# Patient Record
Sex: Male | Born: 1950 | Race: White | Hispanic: No | Marital: Single | Smoking: Never smoker
Health system: Southern US, Community
[De-identification: ages and names within clinical notes are randomized; demographics above are authoritative.]

## PROBLEM LIST (undated history)

## (undated) DIAGNOSIS — I10 Essential (primary) hypertension: Secondary | ICD-10-CM

## (undated) DIAGNOSIS — J309 Allergic rhinitis, unspecified: Secondary | ICD-10-CM

## (undated) DIAGNOSIS — E785 Hyperlipidemia, unspecified: Secondary | ICD-10-CM

## (undated) DIAGNOSIS — N400 Enlarged prostate without lower urinary tract symptoms: Secondary | ICD-10-CM

## (undated) DIAGNOSIS — K219 Gastro-esophageal reflux disease without esophagitis: Secondary | ICD-10-CM

---

## 2001-11-11 ENCOUNTER — Ambulatory Visit (HOSPITAL_BASED_OUTPATIENT_CLINIC_OR_DEPARTMENT_OTHER): Admission: RE | Admit: 2001-11-11 | Discharge: 2001-11-11 | Payer: Self-pay | Admitting: General Surgery

## 2002-02-20 ENCOUNTER — Emergency Department (HOSPITAL_COMMUNITY): Admission: EM | Admit: 2002-02-20 | Discharge: 2002-02-20 | Payer: Self-pay | Admitting: Emergency Medicine

## 2002-02-20 ENCOUNTER — Encounter: Payer: Self-pay | Admitting: Emergency Medicine

## 2005-08-22 ENCOUNTER — Emergency Department (HOSPITAL_COMMUNITY): Admission: EM | Admit: 2005-08-22 | Discharge: 2005-08-22 | Payer: Self-pay | Admitting: Emergency Medicine

## 2016-11-12 ENCOUNTER — Other Ambulatory Visit: Payer: Self-pay | Admitting: Urology

## 2016-11-12 DIAGNOSIS — R972 Elevated prostate specific antigen [PSA]: Secondary | ICD-10-CM

## 2016-11-29 ENCOUNTER — Ambulatory Visit (HOSPITAL_COMMUNITY)
Admission: RE | Admit: 2016-11-29 | Discharge: 2016-11-29 | Disposition: A | Discharge status: Home / Self Care | Payer: BC Managed Care – PPO | Source: Ambulatory Visit | Attending: Urology | Admitting: Urology

## 2016-11-29 DIAGNOSIS — R972 Elevated prostate specific antigen [PSA]: Secondary | ICD-10-CM

## 2016-11-29 LAB — POCT I-STAT CREATININE: CREATININE: 1 mg/dL (ref 0.61–1.24)

## 2016-11-29 MED ORDER — LIDOCAINE HCL 2 % EX GEL
CUTANEOUS | Status: AC
  Filled 2016-11-29: qty 30

## 2016-11-29 MED ORDER — LIDOCAINE HCL 2 % EX GEL: 1.0000 "application " | Freq: Once | CUTANEOUS | Status: DC

## 2016-11-29 MED ORDER — GADOBENATE DIMEGLUMINE 529 MG/ML IV SOLN
20.0000 mL | Freq: Once | INTRAVENOUS | Status: AC | PRN
  Administered 2016-11-29: 19 mL via INTRAVENOUS

## 2019-08-29 ENCOUNTER — Ambulatory Visit: Payer: BC Managed Care – PPO

## 2019-09-03 ENCOUNTER — Ambulatory Visit: Payer: BC Managed Care – PPO

## 2019-09-05 ENCOUNTER — Ambulatory Visit: Payer: BC Managed Care – PPO

## 2019-11-26 ENCOUNTER — Other Ambulatory Visit: Payer: Self-pay | Admitting: Urology

## 2019-11-26 DIAGNOSIS — R972 Elevated prostate specific antigen [PSA]: Secondary | ICD-10-CM

## 2019-12-18 ENCOUNTER — Other Ambulatory Visit: Payer: BC Managed Care – PPO

## 2021-06-29 DIAGNOSIS — I2510 Atherosclerotic heart disease of native coronary artery without angina pectoris: Secondary | ICD-10-CM

## 2021-06-29 DIAGNOSIS — I700 Atherosclerosis of aorta: Secondary | ICD-10-CM

## 2021-06-29 DIAGNOSIS — I77810 Thoracic aortic ectasia: Secondary | ICD-10-CM | POA: Insufficient documentation

## 2021-06-29 HISTORY — PX: TRANSTHORACIC ECHOCARDIOGRAM: SHX275

## 2021-06-29 HISTORY — DX: Atherosclerotic heart disease of native coronary artery without angina pectoris: I25.10

## 2021-06-29 HISTORY — DX: Atherosclerosis of aorta: I70.0

## 2021-06-30 ENCOUNTER — Encounter (HOSPITAL_BASED_OUTPATIENT_CLINIC_OR_DEPARTMENT_OTHER): Payer: Self-pay | Admitting: *Deleted

## 2021-06-30 ENCOUNTER — Other Ambulatory Visit: Payer: Self-pay

## 2021-06-30 ENCOUNTER — Emergency Department (HOSPITAL_BASED_OUTPATIENT_CLINIC_OR_DEPARTMENT_OTHER): Payer: Medicare PPO

## 2021-06-30 ENCOUNTER — Observation Stay (HOSPITAL_BASED_OUTPATIENT_CLINIC_OR_DEPARTMENT_OTHER)
Admission: EM | Admit: 2021-06-30 | Discharge: 2021-07-02 | Disposition: A | Discharge status: Home / Self Care | Payer: Medicare PPO | Attending: Internal Medicine | Admitting: Internal Medicine

## 2021-06-30 DIAGNOSIS — Z79899 Other long term (current) drug therapy: Secondary | ICD-10-CM | POA: Insufficient documentation

## 2021-06-30 DIAGNOSIS — R0602 Shortness of breath: Secondary | ICD-10-CM | POA: Insufficient documentation

## 2021-06-30 DIAGNOSIS — R002 Palpitations: Secondary | ICD-10-CM | POA: Diagnosis present

## 2021-06-30 DIAGNOSIS — Z20822 Contact with and (suspected) exposure to covid-19: Secondary | ICD-10-CM | POA: Diagnosis not present

## 2021-06-30 DIAGNOSIS — E785 Hyperlipidemia, unspecified: Secondary | ICD-10-CM | POA: Diagnosis present

## 2021-06-30 DIAGNOSIS — I10 Essential (primary) hypertension: Secondary | ICD-10-CM | POA: Diagnosis present

## 2021-06-30 DIAGNOSIS — E876 Hypokalemia: Secondary | ICD-10-CM | POA: Diagnosis not present

## 2021-06-30 DIAGNOSIS — I700 Atherosclerosis of aorta: Secondary | ICD-10-CM | POA: Diagnosis not present

## 2021-06-30 DIAGNOSIS — I4819 Other persistent atrial fibrillation: Secondary | ICD-10-CM

## 2021-06-30 DIAGNOSIS — I4891 Unspecified atrial fibrillation: Secondary | ICD-10-CM | POA: Diagnosis not present

## 2021-06-30 HISTORY — DX: Essential (primary) hypertension: I10

## 2021-06-30 HISTORY — DX: Benign prostatic hyperplasia without lower urinary tract symptoms: N40.0

## 2021-06-30 HISTORY — DX: Hyperlipidemia, unspecified: E78.5

## 2021-06-30 HISTORY — DX: Allergic rhinitis, unspecified: J30.9

## 2021-06-30 HISTORY — DX: Gastro-esophageal reflux disease without esophagitis: K21.9

## 2021-06-30 HISTORY — DX: Other persistent atrial fibrillation: I48.19

## 2021-06-30 LAB — COMPREHENSIVE METABOLIC PANEL
ALT: 14 U/L (ref 0–44)
AST: 17 U/L (ref 15–41)
Albumin: 4.7 g/dL (ref 3.5–5.0)
Alkaline Phosphatase: 55 U/L (ref 38–126)
Anion gap: 12 (ref 5–15)
BUN: 23 mg/dL (ref 8–23)
CO2: 24 mmol/L (ref 22–32)
Calcium: 9.2 mg/dL (ref 8.9–10.3)
Chloride: 101 mmol/L (ref 98–111)
Creatinine, Ser: 1.22 mg/dL (ref 0.61–1.24)
GFR, Estimated: >60 mL/min (ref >60)
Glucose, Bld: 125 mg/dL — ABNORMAL HIGH (ref 70–99)
Potassium: 3.4 mmol/L — ABNORMAL LOW (ref 3.5–5.1)
Sodium: 137 mmol/L (ref 135–145)
Total Bilirubin: 1.3 mg/dL — ABNORMAL HIGH (ref 0.3–1.2)
Total Protein: 7.1 g/dL (ref 6.5–8.1)

## 2021-06-30 LAB — CBC WITH DIFFERENTIAL/PLATELET
Abs Immature Granulocytes: 0.02 10*3/uL (ref 0.00–0.07)
Basophils Absolute: 0 10*3/uL (ref 0.0–0.1)
Basophils Relative: 1 %
Eosinophils Absolute: 0 10*3/uL (ref 0.0–0.5)
Eosinophils Relative: 0 %
HCT: 49.1 % (ref 39.0–52.0)
Hemoglobin: 16.3 g/dL (ref 13.0–17.0)
Immature Granulocytes: 0 %
Lymphocytes Relative: 15 %
Lymphs Abs: 1.1 10*3/uL (ref 0.7–4.0)
MCH: 30.6 pg (ref 26.0–34.0)
MCHC: 33.2 g/dL (ref 30.0–36.0)
MCV: 92.3 fL (ref 80.0–100.0)
Monocytes Absolute: 0.7 10*3/uL (ref 0.1–1.0)
Monocytes Relative: 10 %
Neutro Abs: 5.2 10*3/uL (ref 1.7–7.7)
Neutrophils Relative %: 74 %
Platelets: 202 10*3/uL (ref 150–400)
RBC: 5.32 MIL/uL (ref 4.22–5.81)
RDW: 13.2 % (ref 11.5–15.5)
WBC: 7.1 10*3/uL (ref 4.0–10.5)
nRBC: 0 % (ref 0.0–0.2)

## 2021-06-30 LAB — PROTIME-INR
INR: 1 (ref 0.8–1.2)
Prothrombin Time: 13.2 seconds (ref 11.4–15.2)

## 2021-06-30 LAB — RESP PANEL BY RT-PCR (FLU A&B, COVID) ARPGX2
Influenza A by PCR: NEGATIVE
Influenza B by PCR: NEGATIVE
SARS Coronavirus 2 by RT PCR: NEGATIVE

## 2021-06-30 LAB — D-DIMER, QUANTITATIVE: D-Dimer, Quant: 1.34 ug/mL-FEU — ABNORMAL HIGH (ref 0.00–0.50)

## 2021-06-30 MED ORDER — POTASSIUM CHLORIDE CRYS ER 20 MEQ PO TBCR
40.0000 meq | EXTENDED_RELEASE_TABLET | Freq: Once | ORAL | Status: AC
  Administered 2021-06-30: 40 meq via ORAL
  Filled 2021-06-30: qty 2

## 2021-06-30 MED ORDER — DILTIAZEM HCL-DEXTROSE 125-5 MG/125ML-% IV SOLN (PREMIX)
5.0000 mg/h | INTRAVENOUS | Status: DC
  Administered 2021-06-30: 5 mg/h via INTRAVENOUS
  Administered 2021-07-01: 10 mg/h via INTRAVENOUS
  Filled 2021-06-30 (×2): qty 125

## 2021-06-30 MED ORDER — DILTIAZEM LOAD VIA INFUSION
15.0000 mg | Freq: Once | INTRAVENOUS | Status: AC
  Administered 2021-06-30: 15 mg via INTRAVENOUS
  Filled 2021-06-30: qty 15

## 2021-06-30 MED ORDER — IOHEXOL 350 MG/ML SOLN
100.0000 mL | Freq: Once | INTRAVENOUS | Status: AC | PRN
  Administered 2021-06-30: 100 mL via INTRAVENOUS

## 2021-06-30 MED ORDER — SODIUM CHLORIDE 0.9% FLUSH
3.0000 mL | Freq: Once | INTRAVENOUS | Status: AC
  Administered 2021-06-30: 3 mL via INTRAVENOUS
  Filled 2021-06-30: qty 3

## 2021-06-30 MED ORDER — HEPARIN (PORCINE) 25000 UT/250ML-% IV SOLN
1450.0000 [IU]/h | INTRAVENOUS | Status: DC
  Administered 2021-06-30: 1300 [IU]/h via INTRAVENOUS
  Administered 2021-07-01: 1450 [IU]/h via INTRAVENOUS
  Filled 2021-06-30 (×2): qty 250

## 2021-06-30 MED ORDER — HEPARIN BOLUS VIA INFUSION
4000.0000 [IU] | Freq: Once | INTRAVENOUS | Status: AC
  Administered 2021-06-30: 4000 [IU] via INTRAVENOUS

## 2021-06-30 NOTE — ED Triage Notes (Signed)
C/o palpitations x 1 week , sent here PMD for new onset A fib

## 2021-06-30 NOTE — ED Notes (Signed)
Attempted to call report, RN unavailable, will call back. 

## 2021-06-30 NOTE — ED Provider Notes (Signed)
[facility]ER[facility]Y DEPARTMENT Provider Note   CSN: [id number] Arrival date & time: 06/30/21  1648     History Chief Complaint  Patient presents with   Palpitations    Albert [name] is a 70 y.o. male.  HPI      One [place] the beach Noted some lightheadedness with position changes Fluttering, "trembling" feeling in chest, palpitations Started monitoring BP with BP cuff , sometimes ok, sometiems low, had been going to [facility] to exercise, would sometimes check BP, was seeing values like 80/56 not sure if correct, sometimes home monitor would say irregular heart beat No pain Having fluttering, feel it more around chest Called PCP, did ECG, said it was atrial fibrillation Went upstairs to get labwork No chest pain, but maybe a little winded No leg swelling or pain 3hr 65min carride, no other recent surgeries, immobilization, long trips No nausea, vomiting, diarrnea No cough, fever, recent illness, black/bloody stools, numbness/weakness    Past Medical History:  Diagnosis Date   Allergic rhinitis    BPH (benign prostatic hyperplasia)    GERD (gastroesophageal reflux disease)    Hyperlipidemia    Hypertension     Patient Active Problem List   Diagnosis Date Noted   Palpitations 07/01/2021   Hypokalemia 07/01/2021   Hypertension    Hyperlipidemia    Atrial fibrillation with RVR ([place]) 06/30/2021    History reviewed. No pertinent surgical history.     History reviewed. No pertinent family history.  Social History   Tobacco Use   Smoking status: Never   Smokeless tobacco: Never  Substance Use Topics   Alcohol use: Not Currently   Drug use: Not Currently    Home Medications Prior to Admission medications   Not on File    Allergies    Patient has no known allergies.  Review of Systems   Review of Systems  Constitutional:  Negative for fever.  HENT:  Negative for sore throat.   Eyes:  Negative for visual disturbance.   Respiratory:  Positive for shortness of breath. Negative for cough.   Cardiovascular:  Positive for palpitations. Negative for chest pain and leg swelling.  Gastrointestinal:  Negative for abdominal pain, nausea and vomiting.  Genitourinary:  Negative for difficulty urinating.  Musculoskeletal:  Negative for back pain and neck stiffness.  Skin:  Negative for rash.  Neurological:  Positive for light-headedness. Negative for syncope and headaches.   Physical Exam Updated Vital Signs BP 139/81 (BP Location: Left Arm)   Pulse 93   Temp 97.8 F (36.6 C) (Oral)   Resp 18   Ht 6' (1.829 m)   Wt 86.8 kg   SpO2 96%   BMI 25.94 kg/m   Physical Exam Vitals and nursing note reviewed.  Constitutional:      General: He is not in acute distress.    Appearance: He is well-developed. He is not diaphoretic.  HENT:     Head: Normocephalic and atraumatic.  Eyes:     Conjunctiva/sclera: Conjunctivae normal.  Cardiovascular:     Rate and Rhythm: Tachycardia present. Rhythm irregular.     Heart sounds: Normal heart sounds. No murmur heard.   No friction rub. No gallop.  Pulmonary:     Effort: Pulmonary effort is normal. No respiratory distress.     Breath sounds: Normal breath sounds. No wheezing or rales.  Abdominal:     General: There is no distension.     Palpations: Abdomen is soft.     Tenderness:  There is no abdominal tenderness. There is no guarding.  Musculoskeletal:     Cervical back: Normal range of motion.  Skin:    General: Skin is warm and dry.  Neurological:     Mental Status: He is alert and oriented to person, place, and time.    ED Results / Procedures / Treatments   Labs (all labs ordered are listed, but only abnormal results are displayed) Labs Reviewed  COMPREHENSIVE METABOLIC PANEL - Abnormal; Notable for the following components:      Result Value   Potassium 3.4 (*)    Glucose, Bld 125 (*)    Total Bilirubin 1.3 (*)    All other components within normal  limits  D-DIMER, QUANTITATIVE - Abnormal; Notable for the following components:   D-Dimer, Quant 1.34 (*)    All other components within normal limits  RESP PANEL BY RT-PCR (FLU A&B, COVID) [id number]  PROTIME-INR  CBC WITH DIFFERENTIAL/PLATELET  TSH  T4, FREE  HEPARIN LEVEL (UNFRACTIONATED)  CBC  HIV ANTIBODY (ROUTINE TESTING W REFLEX)  MAGNESIUM  COMPREHENSIVE METABOLIC PANEL  URINALYSIS, COMPLETE (UACMP) WITH MICROSCOPIC  RAPID URINE DRUG SCREEN, HOSP PERFORMED  BRAIN NATRIURETIC PEPTIDE    EKG EKG Interpretation  Date/Time:  Friday June 30 2021 17:00:19 EST Ventricular Rate:  146 PR Interval:    QRS Duration: 85 QT Interval:  317 QTC Calculation: 494 R Axis:   -10 Text Interpretation: Atrial fibrillation Paired ventricular premature complexes Minimal ST elevation, inferior leads Borderline prolonged QT interval No previous ECGs available Confirmed by Alvira Monday ([id number]) on 06/30/2021 5:02:26 PM  Radiology DG Chest 2 View  Result Date: 06/30/2021 CLINICAL DATA:  One week of palpitations new onset AFib EXAM: CHEST - 2 VIEW COMPARISON:  July 01, 2015 FINDINGS: Normal size heart. Tortuous thoracic aorta. Chronic bronchitic lung changes. No pleural effusion or pneumothorax. Thoracic spondylosis. IMPRESSION: No active cardiopulmonary disease. Electronically Signed   By: Maudry Mayhew M.D.   On: 06/30/2021 17:51   CT Angio Chest PE W and/or Wo Contrast  Result Date: 06/30/2021 CLINICAL DATA:  Elevated D-dimer level, cardiac palpitations, new onset atrial fibrillation. EXAM: CT ANGIOGRAPHY CHEST WITH CONTRAST TECHNIQUE: Multidetector CT imaging of the chest was performed using the standard protocol during bolus administration of intravenous contrast. Multiplanar CT image reconstructions and MIPs were obtained to evaluate the vascular anatomy. CONTRAST:  OMNIPAQUE IOHEXOL 350 MG/ML SOLN COMPARISON:  Overlapping portions CT abdomen 10/22/2018 FINDINGS: Cardiovascular: No  filling defect is identified in the pulmonary arterial tree to suggest pulmonary embolus. Aortic arch and left anterior descending coronary artery atherosclerotic calcifications. Upper normal heart size. Mediastinum/Nodes: Small type 1 hiatal hernia. No pathologic adenopathy in the chest. Lungs/Pleura: Unremarkable Upper Abdomen: Multiple gallstones in the gallbladder measuring up to about 1.2 cm in diameter. Exophytic right kidney upper pole fluid density lesion favoring cyst although only partially included on today's exam. Mild bilateral perirenal stranding appears roughly symmetric. Musculoskeletal: Thoracic spondylosis. Review of the MIP images confirms the above findings. IMPRESSION: 1. No filling defect is identified in the pulmonary arterial tree to suggest pulmonary embolus. 2. Other imaging findings of potential clinical significance: Aortic Atherosclerosis (ICD10-I70.0). Coronary atherosclerosis. Small type 1 hiatal hernia. Cholelithiasis. Thoracic spondylosis. Electronically Signed   By: Gaylyn Rong M.D.   On: 06/30/2021 20:32    Procedures .Critical Care Performed by: Alvira Monday, MD Authorized by: Alvira Monday, MD   Critical care provider statement:    Critical care time (minutes):  30   Critical care  was time spent personally by me on the following activities:  Development of treatment plan with patient or surrogate, discussions with consultants, evaluation of patient's response to treatment, examination of patient, ordering and review of laboratory studies, ordering and review of radiographic studies, ordering and performing treatments and interventions, pulse oximetry, re-evaluation of patient's condition and review of old charts   Medications Ordered in ED Medications  diltiazem (CARDIZEM) 1 mg/mL load via infusion 15 mg (15 mg Intravenous Bolus from Bag 06/30/21 1746)    And  diltiazem (CARDIZEM) 125 mg in dextrose 5% 125 mL (1 mg/mL) infusion (10 mg/hr Intravenous  Rate/Dose Change 06/30/21 1848)  heparin ADULT infusion 100 units/mL (25000 units/276mL) (1,300 Units/hr Intravenous New Bag/Given 06/30/21 2034)  acetaminophen (TYLENOL) tablet 650 mg (has no administration in time range)    Or  acetaminophen (TYLENOL) suppository 650 mg (has no administration in time range)  sodium chloride flush (NS) 0.9 % injection 3 mL (3 mLs Intravenous Given 06/30/21 1750)  potassium chloride SA (KLOR-CON M) CR tablet 40 mEq (40 mEq Oral Given 06/30/21 1816)  heparin bolus via infusion 4,000 Units (4,000 Units Intravenous Bolus from Bag 06/30/21 2034)  iohexol (OMNIPAQUE) 350 MG/ML injection 100 mL (100 mLs Intravenous Contrast Given 06/30/21 2021)    ED Course  I have reviewed the triage vital signs and the nursing notes.  Pertinent labs & imaging results that were available during my care of the patient were reviewed by me and considered in my medical decision making (see chart for details).    MDM Rules/Calculators/A&P                           70yo male with history of hypertension, hyperlipidemia presents with concern for palpitations, new onset atrial fibrillation with rapid rate.   Chest x-ray shows no evidence of pulmonary edema, pneumonia or other acute abnormalities.  CBC shows a normal hemoglobin and white blood cell count.  CMP shows very mild hypokalemia, no other significant electrolyte abnormalities.  D-dimer positive.  Initial heart rate up to the 160s with atrial fibrillation and RVR.  He is started on diltiazem bolus and drip.  PE study negative for acute PE.   CHADSVASC 2. Discussed risks and benefits of anticoagulation.  Diltiazem gtt to 10, HR improved to 100s. Admitted for further care.  Final Clinical Impression(s) / ED Diagnoses Final diagnoses:  Atrial fibrillation with RVR [facility])    Rx / DC Orders ED Discharge Orders     None        Alvira Monday, MD 07/01/21 [phone]

## 2021-06-30 NOTE — Progress Notes (Signed)
ANTICOAGULATION CONSULT NOTE - Initial Consult  Pharmacy Consult for Heparin Indication: atrial fibrillation  No Known Allergies  Patient Measurements: Height: 6' (182.9 cm) Weight: 88.9 kg (196 lb) IBW/kg (Calculated) : 77.6 Heparin Dosing Weight: 88.9 kg  Vital Signs: Temp: 98.3 F (36.8 C) (12/02 1701) Temp Source: Oral (12/02 1701) BP: 122/80 (12/02 1845) Pulse Rate: 98 (12/02 1845)  Labs: Recent Labs    06/30/21 1710  HGB 16.3  HCT 49.1  PLT 202  LABPROT 13.2  INR 1.0  CREATININE 1.22    Estimated Creatinine Clearance: 63.6 mL/min (by C-G formula based on SCr of 1.22 mg/dL).   Medical History: Past Medical History:  Diagnosis Date   Hyperlipidemia    Hypertension     Medications:  (Not in a hospital admission)  Scheduled:  Infusions:   diltiazem (CARDIZEM) infusion 10 mg/hr (06/30/21 1848)   PRN:   Assessment: 70 yom presenting with palpitations x 1 week. Heparin per pharmacy consult placed for atrial fibrillation.  Patient is on not on anticoagulation prior to arrival.  Hgb 16.3; plt 202  Goal of Therapy:  Heparin level 0.3-0.7 units/ml Monitor platelets by anticoagulation protocol: Yes   Plan:  Give 4000 units bolus x 1 Start heparin infusion at 1300 units/hr Check anti-Xa level in 6-8 hours and daily while on heparin Continue to monitor H&H and platelets  Delmar Landau, PharmD, BCPS 06/30/2021 7:39 PM ED Clinical Pharmacist -  [phone]

## 2021-07-01 ENCOUNTER — Encounter (HOSPITAL_COMMUNITY): Payer: Self-pay | Admitting: Family Medicine

## 2021-07-01 ENCOUNTER — Inpatient Hospital Stay (HOSPITAL_BASED_OUTPATIENT_CLINIC_OR_DEPARTMENT_OTHER): Payer: Medicare PPO

## 2021-07-01 DIAGNOSIS — I4891 Unspecified atrial fibrillation: Secondary | ICD-10-CM

## 2021-07-01 DIAGNOSIS — R739 Hyperglycemia, unspecified: Secondary | ICD-10-CM

## 2021-07-01 DIAGNOSIS — E876 Hypokalemia: Secondary | ICD-10-CM | POA: Diagnosis not present

## 2021-07-01 DIAGNOSIS — I10 Essential (primary) hypertension: Secondary | ICD-10-CM | POA: Diagnosis not present

## 2021-07-01 DIAGNOSIS — R002 Palpitations: Secondary | ICD-10-CM | POA: Diagnosis present

## 2021-07-01 DIAGNOSIS — I700 Atherosclerosis of aorta: Secondary | ICD-10-CM | POA: Diagnosis not present

## 2021-07-01 DIAGNOSIS — E785 Hyperlipidemia, unspecified: Secondary | ICD-10-CM | POA: Diagnosis present

## 2021-07-01 DIAGNOSIS — Z20822 Contact with and (suspected) exposure to covid-19: Secondary | ICD-10-CM | POA: Diagnosis not present

## 2021-07-01 LAB — BRAIN NATRIURETIC PEPTIDE: B Natriuretic Peptide: 189.7 pg/mL — ABNORMAL HIGH (ref 0.0–100.0)

## 2021-07-01 LAB — ECHOCARDIOGRAM COMPLETE
AR max vel: 3.42 cm2
AV Peak grad: 8.8 mmHg
Ao pk vel: 1.48 m/s
Area-P 1/2: 4.65 cm2
Calc EF: 61.1 %
Height: 72 in
P 1/2 time: 417 msec
S' Lateral: 3 cm
Single Plane A2C EF: 55.8 %
Single Plane A4C EF: 64.8 %
Weight: 3058.22 oz

## 2021-07-01 LAB — CBC
HCT: 45.7 % (ref 39.0–52.0)
Hemoglobin: 15.1 g/dL (ref 13.0–17.0)
MCH: 30.5 pg (ref 26.0–34.0)
MCHC: 33 g/dL (ref 30.0–36.0)
MCV: 92.3 fL (ref 80.0–100.0)
Platelets: 213 10*3/uL (ref 150–400)
RBC: 4.95 MIL/uL (ref 4.22–5.81)
RDW: 13.2 % (ref 11.5–15.5)
WBC: 6.4 10*3/uL (ref 4.0–10.5)
nRBC: 0 % (ref 0.0–0.2)

## 2021-07-01 LAB — URINALYSIS, COMPLETE (UACMP) WITH MICROSCOPIC
Bacteria, UA: NONE SEEN
Bilirubin Urine: NEGATIVE
Glucose, UA: NEGATIVE mg/dL
Hgb urine dipstick: NEGATIVE
Ketones, ur: NEGATIVE mg/dL
Leukocytes,Ua: NEGATIVE
Nitrite: NEGATIVE
Protein, ur: NEGATIVE mg/dL
Specific Gravity, Urine: 1.043 — ABNORMAL HIGH (ref 1.005–1.030)
pH: 5 (ref 5.0–8.0)

## 2021-07-01 LAB — COMPREHENSIVE METABOLIC PANEL
ALT: 12 U/L (ref 0–44)
AST: 13 U/L — ABNORMAL LOW (ref 15–41)
Albumin: 3.7 g/dL (ref 3.5–5.0)
Alkaline Phosphatase: 46 U/L (ref 38–126)
Anion gap: 7 (ref 5–15)
BUN: 12 mg/dL (ref 8–23)
CO2: 25 mmol/L (ref 22–32)
Calcium: 8.9 mg/dL (ref 8.9–10.3)
Chloride: 107 mmol/L (ref 98–111)
Creatinine, Ser: 1.11 mg/dL (ref 0.61–1.24)
GFR, Estimated: >60 mL/min (ref >60)
Glucose, Bld: 111 mg/dL — ABNORMAL HIGH (ref 70–99)
Potassium: 4 mmol/L (ref 3.5–5.1)
Sodium: 139 mmol/L (ref 135–145)
Total Bilirubin: 1.1 mg/dL (ref 0.3–1.2)
Total Protein: 5.9 g/dL — ABNORMAL LOW (ref 6.5–8.1)

## 2021-07-01 LAB — RAPID URINE DRUG SCREEN, HOSP PERFORMED
Amphetamines: NOT DETECTED
Barbiturates: NOT DETECTED
Benzodiazepines: NOT DETECTED
Cocaine: NOT DETECTED
Opiates: NOT DETECTED
Tetrahydrocannabinol: NOT DETECTED

## 2021-07-01 LAB — HIV ANTIBODY (ROUTINE TESTING W REFLEX): HIV Screen 4th Generation wRfx: NONREACTIVE

## 2021-07-01 LAB — HEPARIN LEVEL (UNFRACTIONATED): Heparin Unfractionated: 0.2 IU/mL — ABNORMAL LOW (ref 0.30–0.70)

## 2021-07-01 LAB — MAGNESIUM: Magnesium: 2.1 mg/dL (ref 1.7–2.4)

## 2021-07-01 LAB — TSH: TSH: 2.025 u[IU]/mL (ref 0.350–4.500)

## 2021-07-01 LAB — T4, FREE: Free T4: 1.11 ng/dL (ref 0.61–1.12)

## 2021-07-01 MED ORDER — ACETAMINOPHEN 325 MG PO TABS: 650.0000 mg | ORAL_TABLET | Freq: Four times a day (QID) | ORAL | Status: DC | PRN

## 2021-07-01 MED ORDER — APIXABAN 5 MG PO TABS
5.0000 mg | ORAL_TABLET | Freq: Two times a day (BID) | ORAL | Status: DC
  Administered 2021-07-01 – 2021-07-02 (×3): 5 mg via ORAL
  Filled 2021-07-01 (×3): qty 1

## 2021-07-01 MED ORDER — DILTIAZEM HCL 60 MG PO TABS: 30.0000 mg | ORAL_TABLET | Freq: Three times a day (TID) | ORAL | Status: DC | PRN

## 2021-07-01 MED ORDER — DILTIAZEM HCL ER COATED BEADS 240 MG PO CP24
240.0000 mg | ORAL_CAPSULE | Freq: Every day | ORAL | Status: DC
  Administered 2021-07-01 – 2021-07-02 (×2): 240 mg via ORAL
  Filled 2021-07-01 (×2): qty 1

## 2021-07-01 MED ORDER — ROSUVASTATIN CALCIUM 5 MG PO TABS
10.0000 mg | ORAL_TABLET | Freq: Every day | ORAL | Status: DC
  Administered 2021-07-01 – 2021-07-02 (×2): 10 mg via ORAL
  Filled 2021-07-01 (×2): qty 2

## 2021-07-01 MED ORDER — ACETAMINOPHEN 650 MG RE SUPP: 650.0000 mg | Freq: Four times a day (QID) | RECTAL | Status: DC | PRN

## 2021-07-01 NOTE — H&P (Signed)
History and Physical    PLEASE NOTE THAT DRAGON DICTATION SOFTWARE WAS USED IN THE CONSTRUCTION OF THIS NOTE.   Albert [name] [id number] DOB: [birth year] DOA: 06/30/2021  PCP: Jonathon Bellows, PA-C  Patient coming from: home   I have personally briefly reviewed patient's old medical records in [place]  Chief Complaint: Palpitations  HPI: Albert [name] is a 70 y.o. male with medical history significant for hypertension, hyperlipidemia, who is admitted to [facility] on 06/30/2021 by way of transfer from [place] ED with new diagnosis of atrial fibrillation with RVR after presenting from  to [facility] from PCP's office for further evaluation of afib rvr.   The patient reports that he has been experiencing new palpitations over the course of the last week.  Denies any associated chest pain, diaphoresis, dizziness, presyncope, or syncope over that timeframe.  Denies any shortness of breath at rest, but has noted some increased shortness of breath with ambulating upstairs over the last week.  Not associate with any orthopnea, PND, or new onset peripheral edema.  Denies any recent subjective fever, chills, rigors, or generalized myalgias.  No nausea, vomiting, diarrhea, abdominal pain.  Denies any associated cough, hemoptysis, calf tenderness, or new lower extremity erythema.   Denies any personal or family history of acute DVT/PE.  He reports that he recently traveled via car ride to the beach, noting continuous travel time of approximately 3 hours in the setting.  Otherwise, no recent traveling, denies any recent trauma, surgical procedures.  No history of underlying malignancy.  Not on any blood thinners as an outpatient, including no aspirin.  Denies any regular or recent alcohol consumption and no use of recreational drugs.  No known chronic underlying pulmonary pathology, and reports that he is a lifelong non-smoker.  Denies any known history of prior  atrial fibrillation.  Over the last several days, when working out at [employer], the cardiac monitor has been reading "irregular" which represents a change for him.  He subsequently presented to his PCPs office earlier today, at which time EKG demonstrated atrial fibrillation with RVR, prompting patient to be instructed to present to local emergency department for further evaluation and management of atrial fibrillation with RVR.  Subsequently, he presented to [place] ED.      [facility] ED Course:  Vital signs in the ED were notable for the following: Afebrile; initial heart rates reportedly in the 160s, subsequently improving into the 90s to low 100s following initiation of diltiazem drip; blood pressure 108/67 -141/91; respiratory rate 16-23, oxygen saturation 97 to 100% on room air.  Labs were notable for the following: CMP notable for the following: Sodium 137, potassium 3.2, creatinine 1.22, and liver enzymes found to be within normal limits.  TSH 2.025.  CBC notable for white cell count 7100, hemoglobin 16.3.  D-dimer 1.34.  INR 1.0.  COVID-19/influenza PCR were checked in the ED this evening, and found to be negative.  Imaging and additional notable ED work-up: Initial EKG at [facility] demonstrated atrial fibrillation with RVR and single PVC associated with ventricular rate of 146, nonspecific T wave inversion in aVL, without prior EKG available for point comparison, no evidence of ST changes, including no evidence of ST elevation.  Chest x-ray showed no evidence of acute cardiopulmonary process, including no evidence of infiltrate, edema, effusion, or pneumothorax.  In the setting of the above symptoms and an elevated D-dimer, CTA chest was pursued  and showed no evidence of acute pulmonary embolism, and no evidence of acute pulmonary pathology, including no infiltrate, edema, effusion, or pneumothorax.  While in the ED, the following were administered:  Diltiazem 15 mg IV bolus followed by initiation of diltiazem drip.  Heparin bolus followed by initiation of heparin drip.  Potassium chloride 40 mEq p.o. x1.  Subsequently the patient was transferred for admission to [facility] for further evaluation management of new diagnosis of atrial fibrillation with RVR.     Review of Systems: As per HPI otherwise 10 point review of systems negative.   Past Medical History:  Diagnosis Date   Hyperlipidemia    Hypertension     History reviewed. No pertinent surgical history.  Social History:  reports that he has never smoked. He has never used smokeless tobacco. He reports that he does not currently use alcohol. He reports that he does not currently use drugs.   No Known Allergies  History reviewed. No pertinent family history.   Outpatient medications include the following: HCTZ, lisinopril, rosuvastatin 10 mg p.o. daily.   Objective    Physical Exam: Vitals:   06/30/21 2330 06/30/21 2345 06/30/21 2359 07/01/21 0051  BP: 108/67 (!) 112/52  139/81  Pulse: 91 66  93  Resp: 20 19  18   Temp:   98.4 F (36.9 C) 97.8 F (36.6 C)  TempSrc:   Oral Oral  SpO2: 98% 97%  96%  Weight:    86.8 kg  Height:    6' (1.829 m)    General: appears to be stated age; alert, oriented Skin: warm, dry, no rash Head:  AT/[place] Mouth:  Oral mucosa membranes appear moist, normal dentition Neck: supple; trachea midline Heart:  RRR; did not appreciate any M/R/G Lungs: CTAB, did not appreciate any wheezes, rales, or rhonchi Abdomen: + BS; soft, ND, NT Vascular: 2+ pedal pulses b/l; 2+ radial pulses b/l Extremities: no peripheral edema, no muscle wasting Neuro: strength and sensation intact in upper and lower extremities b/l     Labs on Admission: I have personally reviewed following labs and imaging studies  CBC: Recent Labs  Lab 06/30/21 1710  WBC 7.1  NEUTROABS 5.2  HGB 16.3  HCT 49.1  MCV 92.3  PLT 123XX123   Basic Metabolic Panel: Recent  Labs  Lab 06/30/21 1710  NA 137  K 3.4*  CL 101  CO2 24  GLUCOSE 125*  BUN 23  CREATININE 1.22  CALCIUM 9.2   GFR: Estimated Creatinine Clearance: 63.6 mL/min (by C-G formula based on SCr of 1.22 mg/dL). Liver Function Tests: Recent Labs  Lab 06/30/21 1710  AST 17  ALT 14  ALKPHOS 55  BILITOT 1.3*  PROT 7.1  ALBUMIN 4.7   No results for input(s): LIPASE, AMYLASE in the last 168 hours. No results for input(s): AMMONIA in the last 168 hours. Coagulation Profile: Recent Labs  Lab 06/30/21 1710  INR 1.0   Cardiac Enzymes: No results for input(s): CKTOTAL, CKMB, CKMBINDEX, TROPONINI in the last 168 hours. BNP (last 3 results) No results for input(s): PROBNP in the last 8760 hours. HbA1C: No results for input(s): HGBA1C in the last 72 hours. CBG: No results for input(s): GLUCAP in the last 168 hours. Lipid Profile: No results for input(s): CHOL, HDL, LDLCALC, TRIG, CHOLHDL, LDLDIRECT in the last 72 hours. Thyroid Function Tests: Recent Labs    06/30/21 1710  TSH 2.025  FREET4 1.11   Anemia Panel: No results for input(s): VITAMINB12, FOLATE, FERRITIN, TIBC, IRON, RETICCTPCT  in the last 72 hours. Urine analysis: No results found for: COLORURINE, APPEARANCEUR, [place], [place], [place], [place], [place], [place], [place], [place], NITRITE, LEUKOCYTESUR  Radiological Exams on Admission: DG Chest 2 View  Result Date: 06/30/2021 CLINICAL DATA:  One week of palpitations new onset AFib EXAM: CHEST - 2 VIEW COMPARISON:  July 01, 2015 FINDINGS: Normal size heart. Tortuous thoracic aorta. Chronic bronchitic lung changes. No pleural effusion or pneumothorax. Thoracic spondylosis. IMPRESSION: No active cardiopulmonary disease. Electronically Signed   By: Dahlia Bailiff M.D.   On: 06/30/2021 17:51   CT Angio Chest PE W and/or Wo Contrast  Result Date: 06/30/2021 CLINICAL DATA:  Elevated D-dimer level, cardiac palpitations, new onset atrial fibrillation. EXAM:  CT ANGIOGRAPHY CHEST WITH CONTRAST TECHNIQUE: Multidetector CT imaging of the chest was performed using the standard protocol during bolus administration of intravenous contrast. Multiplanar CT image reconstructions and MIPs were obtained to evaluate the vascular anatomy. CONTRAST:  119mL OMNIPAQUE IOHEXOL 350 MG/ML SOLN COMPARISON:  Overlapping portions CT abdomen 10/22/2018 FINDINGS: Cardiovascular: No filling defect is identified in the pulmonary arterial tree to suggest pulmonary embolus. Aortic arch and left anterior descending coronary artery atherosclerotic calcifications. Upper normal heart size. Mediastinum/Nodes: Small type 1 hiatal hernia. No pathologic adenopathy in the chest. Lungs/Pleura: Unremarkable Upper Abdomen: Multiple gallstones in the gallbladder measuring up to about 1.2 cm in diameter. Exophytic right kidney upper pole fluid density lesion favoring cyst although only partially included on today's exam. Mild bilateral perirenal stranding appears roughly symmetric. Musculoskeletal: Thoracic spondylosis. Review of the MIP images confirms the above findings. IMPRESSION: 1. No filling defect is identified in the pulmonary arterial tree to suggest pulmonary embolus. 2. Other imaging findings of potential clinical significance: Aortic Atherosclerosis (ICD10-I70.0). Coronary atherosclerosis. Small type 1 hiatal hernia. Cholelithiasis. Thoracic spondylosis. Electronically Signed   By: Van Clines M.D.   On: 06/30/2021 20:32     EKG: Independently reviewed, with result as described above.    Assessment/Plan   Principal Problem:   Atrial fibrillation with RVR (HCC) Active Problems:   Palpitations   Hypokalemia   Hypertension   Hyperlipidemia    #) Atrial fibrillation with RVR: In the setting of no known history of atrial fibrillation, was found to be in atrial fibrillation with RVR initial heart rates in the 160s when presenting to PCPs office for further evaluation of 1 week of  palpitations.  Not associate with any chest pain.  Rate control improving on diltiazem drip, with blood pressure tolerating both the RVR as well as interval initiation of diltiazem drip, without any evidence of hypotension.  EKG confirmed atrial fibrillation with RVR, and showed nonspecific T wave inversion in aVL, but otherwise no evidence of acute ischemic changes, including no evidence of ST elevation.  No prior echocardiogram per chart review.  In the setting of a CHA2DS2-VASc score of 3, there is an indication for the patient to be on chronic anticoagulation for thromboembolic prophylaxis.  Consequently, started on heparin drip at [facility] ED, with plan to convert to Eliquis.  Not currently on any oral AV nodal blocking agents at home.  Will warrant initiation of p.o. AV nodal blocking agent prior to discontinuation of diltiazem drip to prevent rebound tachycardia.  No clinical or radiographic evidence to suggest acutely decompensated heart failure.  No evidence of underlying infectious stimuli, including chest x-ray without acute process, while COVID-19/influenza PCR found to be negative. Will also eval UA.  TSH within normal limits.   Plan: Monitor strict I's and  O's and daily weights. Monitor on telemetry. Check serum magnesium level with prn supplementation to maintain levels of greater than or equal to 2.0.  Further evaluation management of presenting hypokalemia, as further detailed below, with repeat BMP in the AM.  Repeat CBC in the AM.  Diltiazem drip, with plan to initiate oral AV nodal blocking agent prior to discontinuation of this drip.  Heparin drip, with plan to convert to Eliquis.  Check urinalysis/urinary drug screen.  Echocardiogram ordered for the morning.  Add on BNP.  Repeat EKG to document, further evaluate interval improvement in rate control.      #) Hypokalemia: Presenting serum potassium mildly low at 3.4.  Status post potassium chloride 40 mill equivalents p.o.  x1 dose.  Plan: Repeat BMP in the morning.  Add on serum magnesium level.  Monitor on telemetry.       #) Essential Hypertension: documented history of such, with outpatient antihypertensive regimen including lisinopril and HCTZ.  Systolic blood pressures pressure in the ED today but in the low 100s to 140s, without any associated hypotension in spite of atrial fibrillation with RVR and interval initiation of diltiazem drip.   Plan: Close monitoring of subsequent blood pressure via routine vital signs. Will plan to resume outpatient lisinopril if no evidence of interval hypotension.  Holding home HCTZ for now.       #) Hyperlipidemia: documented h/o such. On rosuvastatin as outpatient.   Plan: continue home statin.      DVT prophylaxis: SCD's, hep drip   Code Status: Full code Family Communication: none Disposition Plan: Per Rounding Team Consults called: none;  Admission status: inpt, pcu  Warrants inpatient status on basis of need for further evaluation and management of new diagnosis of atrial fibrillation with RVR, including administration of IV AV nodal blocking agents with close monitoring of ensuing rate control as well as further evaluation of underlying contributing factors, including pursuit of echocardiogram along with close monitoring of rate rhythm on telemetry.   PLEASE NOTE THAT DRAGON DICTATION SOFTWARE WAS USED IN THE CONSTRUCTION OF THIS NOTE.   Chaney Born Aamirah Salmi DO [facility]  From 7PM - 7AM   07/01/2021, 1:36 AM

## 2021-07-01 NOTE — Progress Notes (Signed)
Care started prior to midnight in the emergency room and patient was admitted early this morning after midnight by Dr. Newton Pigg and I am in current agreement with his assessment and plan.  Additional changes to the plan of care have been made accordingly.  The patient is a 70 year old with a past medical history significant for but not limited to hypertension, hyperlipidemia as well as other comorbidities who was admitted to [facility] on 06/30/2021 via med [facility] transfer for atrial fibrillation with RVR.  Patient reported experiencing new palpitations last week or so and denies chest pain, diaphoresis, dizziness or presyncope or syncope.  States that he did feel lightheaded when he would bend over and just attributed to being dehydrated.  He had no new onset peripheral edema and denies any history of DVT or PE.  He recently traveled to the beach and is fairly active usually and over last several days when he is working out at the [facility] the cardiac monitor read "irregular rhythm" which is a change for him.  He presented to his PCP office yesterday at which time it demonstrated A. fib with RVR and so he was sent to [facility].  In the ED he was noted to be A. fib with RVR with a ventricular rate of 146 and nonspecific T wave inversion in aVL.  Chest x-ray was done and showed no acute cardiopulmonary process and there is no evidence of infiltrate, edema or pneumothorax.  D-dimer was elevated slightly so CT of the chest was done and showed no acute PE and there is no evidence of acute pulmonary pathology or infiltrate, edema effusion or pneumothorax.  The ED he was initiated on diltiazem and heparin drip and transferred to [facility] for new onset A. fib with RVR. Currently he is being admitted and treated for the following but not limited too:  A Fib With RVR -Presented with A. fib with RVR in the 160s and has had been having palpitations for the last week or so -No chest pain and was  initiated on diltiazem drip with improvement in his rate -EKG confirmed A. fib with RVR and showed nonspecific T wave inversion in aVL -CHA2DS2-VASc score of 3 and he was initiated on anticoagulation with a heparin drip and will be changed to p.o. apixaban -Check TSH and echocardiogram -Patient remains on a diltiazem drip and is being transitioned from adult drip to diltiazem to 40 mg CD daily as well as being initiated on diltiazem 30 mg p.o. 3 times daily as needed for breakthrough -Cardiology was consulted for further evaluation recommendations and cardiology is transition the patient to p.o. Cardizem -BNP was mildly elevated 189.7 but appears euvolemic  Hypokalemia -Mild at 3.4 and will replete -Repeat potassium this morning was 4.0 -Check magnesium level in the a.m. -Continue to monitor CMP  Essential hypertension -He takes lisinopril hydrochlorothiazide in outpatient setting -Currently initiated on diltiazem drip -His HCTZ has been held currently in his lisinopril has been resumed -Cardiology is following and recommending change to p.o.  Hyperlipidemia -Continue statin  Hyperglycemia -Check hemoglobin A1c in a.m. -Continue to monitor blood sugars per protocol and if necessary place on sensitive NovoLog/scale insulin before meals and at bedtime  We will continue to monitor the patient's clinical response to intervention and repeat blood work in the a.m. and follow-up on specialist recommendations

## 2021-07-01 NOTE — Progress Notes (Signed)
Pt transitioned from Diltiazem IV to PO and from Heparin IV to Eliquis PO. Education including but not limited to indication, monitoring, risks and adverse effects provided to patient and spouse as well as when to call a provider or check into an emergency room. All questions and concerns addressed. Pt & spouse verbalized understanding. Currently running atrial fibrillation in the 80s. Call bell placed within reach. Will continue to monitor and maintain safety.

## 2021-07-01 NOTE — Progress Notes (Addendum)
ANTICOAGULATION CONSULT NOTE - Initial Consult  Pharmacy Consult for Heparin>apixaban Indication: atrial fibrillation  No Known Allergies  Patient Measurements: Height: 6' (182.9 cm) Weight: 86.7 kg (191 lb 2.2 oz) IBW/kg (Calculated) : 77.6 Heparin Dosing Weight: 88.9 kg  Vital Signs: Temp: 97.8 F (36.6 C) (12/03 0543) Temp Source: Oral (12/03 0543) BP: 96/79 (12/03 0543) Pulse Rate: 83 (12/03 0543)  Labs: Recent Labs    06/30/21 1710 07/01/21 0621  HGB 16.3 15.1  HCT 49.1 45.7  PLT 202 213  LABPROT 13.2  --   INR 1.0  --   HEPARINUNFRC  --  0.20*  CREATININE 1.22 1.11     Estimated Creatinine Clearance: 69.9 mL/min (by C-G formula based on SCr of 1.11 mg/dL).   Medical History: Past Medical History:  Diagnosis Date   Allergic rhinitis    BPH (benign prostatic hyperplasia)    GERD (gastroesophageal reflux disease)    Hyperlipidemia    Hypertension     Medications:  No medications prior to admission.    Scheduled:  Infusions:   diltiazem (CARDIZEM) infusion 10 mg/hr (07/01/21 2353)   heparin 1,300 Units/hr (06/30/21 2034)   PRN:   Assessment: 70 yom presenting with palpitations x 1 week. Heparin per pharmacy consult placed for atrial fibrillation.  Patient is on not on anticoagulation prior to arrival.  Heparin came back subtherapeutic this AM. We will increase rate for now. Messaged MD to see if we can switch to Lovenox or apixaban.   Addendum  Ok to transition to apixaban per Dr Marland Mcalpine.  Age<80, wt>60kg, scr <1.5  Goal of Therapy:  Monitor platelets by anticoagulation protocol: Yes   Plan:   Dc heparin Apixaban 5mg  PO BID Rx will follow peripherally  , PharmD, BCIDP, AAHIVP, CPP Infectious Disease Pharmacist 07/01/2021 7:43 AM

## 2021-07-01 NOTE — Consult Note (Signed)
Cardiology Consultation:   Patient ID: Albert [name] MRN: [id number]; DOB: June 25, 1951  Admit date: 06/30/2021 Date of Consult: 07/01/2021  PCP:  Albert Mech, PA-C   [facility] Providers Cardiologist:  None       Patient Profile:   Albert [name] is a 70 y.o. male with a hx of HTN and HLD who is being seen 07/01/2021 for the evaluation of atrial fibrillation with RVR at the request of Dr. Marland [name].  History of Present Illness:   Albert [name] is a 70 year old male with history detailed above who has not seen Cardiology in the past.  The patient states that over the past couple of weeks, he had noted increased palpitations and feeling more fatigued. Over the course of the last couple of days, he noted he was significantly more short of breath and fatigued after exercising. He began monitoring his blood pressures and the cuff often told him he was having an "irregular rhythm." He went to see his PCP who found he was in new Afib with RVR with initial HR 160s. He was referred to [facility] and subsequently transferred to [facility].   In the ER, labs notable from cr 1.22, K 3.2, TSH 2.025, D-dimer 1.34. COVID and flu negative. ECG with Afib with RVR with HR 146. CXR without acute pathology. CTA negative for PE or acute pathology.  Currently, the patient is feeling better. HR 80-110s on dilt gtt. No chest pain or SOB.  Past Medical History:  Diagnosis Date   Allergic rhinitis    BPH (benign prostatic hyperplasia)    GERD (gastroesophageal reflux disease)    Hyperlipidemia    Hypertension     History reviewed. No pertinent surgical history.   Home Medications:  Prior to Admission medications   Medication Sig Start Date End Date Taking? Authorizing Provider  Cholecalciferol 50 MCG (2000 UT) TABS Take 2,000 Units by mouth daily.   Yes [provider]  Cyanocobalamin (B-12 PO) Take 1 tablet by mouth daily.   Yes [provider]   hydrochlorothiazide (HYDRODIURIL) 25 MG tablet Take 12.5 mg by mouth every morning. 06/26/21  Yes [provider]  lisinopril (ZESTRIL) 40 MG tablet Take 40 mg by mouth daily. 05/31/21  Yes [provider]  rosuvastatin (CRESTOR) 10 MG tablet Take 10 mg by mouth at bedtime. 05/31/21  Yes [provider]    Inpatient Medications: Scheduled Meds:  Continuous Infusions:  diltiazem (CARDIZEM) infusion 10 mg/hr (07/01/21 0619)   heparin 1,450 Units/hr (07/01/21 0751)   PRN Meds: acetaminophen **OR** acetaminophen  Allergies:   No Known Allergies  Social History:   Social History   Socioeconomic History   Marital status: Single    Spouse name: Not on file   Number of children: Not on file   Years of education: Not on file   Highest education level: Not on file  Occupational History   Not on file  Tobacco Use   Smoking status: Never   Smokeless tobacco: Never  Substance and Sexual Activity   Alcohol use: Not Currently   Drug use: Not Currently   Sexual activity: Not on file  Other Topics Concern   Not on file  Social History Narrative   Not on file   Social Determinants of Health   Financial Resource Strain: Not on file  Food Insecurity: Not on file  Transportation Needs: Not on file  Physical Activity: Not on file  Stress: Not on file  Social Connections: Not on  file  Intimate Partner Violence: Not on file    Family History:   History reviewed. No pertinent family history.   ROS:  Please see the history of present illness.   Review of Systems  Constitutional:  Positive for malaise/fatigue. Negative for chills and fever.  Respiratory:  Positive for shortness of breath.   Cardiovascular:  Positive for palpitations. Negative for chest pain.  Gastrointestinal:  Negative for nausea and vomiting.  Musculoskeletal:  Negative for falls.  Neurological:  Positive for dizziness. Negative for loss of consciousness.      Physical Exam/Data:    Vitals:   07/01/21 0051 07/01/21 0350 07/01/21 0543 07/01/21 0755  BP: 139/81  96/79 127/86  Pulse: 93  83 81  Resp: 18  17 17   Temp: 97.8 F (36.6 C)  97.8 F (36.6 C) 97.9 F (36.6 C)  TempSrc: Oral  Oral Axillary  SpO2: 96%  96% 97%  Weight: 86.8 kg 86.7 kg    Height: 6' (1.829 m)      No intake or output data in the 24 hours ending 07/01/21 1213 Last 3 Weights 07/01/2021 07/01/2021 06/30/2021  Weight (lbs) 191 lb 2.2 oz 191 lb 4.8 oz 196 lb  Weight (kg) 86.7 kg 86.773 kg 88.905 kg     Body mass index is 25.92 kg/m.  General:  Well nourished, well developed, in no acute distress HEENT: normal Neck: no JVD Vascular: No carotid bruits; Distal pulses 2+ bilaterally Cardiac:  Irregularly irregular, no murmurs Lungs:  clear to auscultation bilaterally, no wheezing, rhonchi or rales  Abd: soft, nontender, no hepatomegaly  Ext: no edema Musculoskeletal:  No deformities, BUE and BLE strength normal and equal Skin: warm and dry  Neuro:  CNs 2-12 intact, no focal abnormalities noted Psych:  Normal affect   EKG:  The EKG was personally reviewed and demonstrates:  Afib with PVC, HR 146 Telemetry:  Telemetry was personally reviewed and demonstrates:  Afib with HR 80-110s; occasional PVCs  Relevant CV Studies: CTA chest 06/30/21 Multidetector CT imaging of the chest was performed using the standard protocol during bolus administration of intravenous contrast. Multiplanar CT image reconstructions and MIPs were obtained to evaluate the vascular anatomy.   CONTRAST:  133mL OMNIPAQUE IOHEXOL 350 MG/ML SOLN   COMPARISON:  Overlapping portions CT abdomen 10/22/2018   FINDINGS: Cardiovascular: No filling defect is identified in the pulmonary arterial tree to suggest pulmonary embolus. Aortic arch and left anterior descending coronary artery atherosclerotic calcifications. Upper normal heart size.   Mediastinum/Nodes: Small type 1 hiatal hernia. No pathologic adenopathy in the  chest.   Lungs/Pleura: Unremarkable   Upper Abdomen: Multiple gallstones in the gallbladder measuring up to about 1.2 cm in diameter. Exophytic right kidney upper pole fluid density lesion favoring cyst although only partially included on today's exam. Mild bilateral perirenal stranding appears roughly symmetric.   Musculoskeletal: Thoracic spondylosis.   Review of the MIP images confirms the above findings.   IMPRESSION: 1. No filling defect is identified in the pulmonary arterial tree to suggest pulmonary embolus. 2. Other imaging findings of potential clinical significance: Aortic Atherosclerosis (ICD10-I70.0). Coronary atherosclerosis. Small type 1 hiatal hernia. Cholelithiasis. Thoracic spondylosis.  Laboratory Data:  High Sensitivity Troponin:  No results for input(s): TROPONINIHS in the last 720 hours.   Chemistry Recent Labs  Lab 06/30/21 1710 07/01/21 0621  NA 137 139  K 3.4* 4.0  CL 101 107  CO2 24 25  GLUCOSE 125* 111*  BUN 23 12  CREATININE 1.22 1.11  CALCIUM 9.2 8.9  MG  --  2.1  GFRNONAA >60 >60  ANIONGAP 12 7    Recent Labs  Lab 06/30/21 1710 07/01/21 0621  PROT 7.1 5.9*  ALBUMIN 4.7 3.7  AST 17 13*  ALT 14 12  ALKPHOS 55 46  BILITOT 1.3* 1.1   Lipids No results for input(s): CHOL, TRIG, HDL, LABVLDL, LDLCALC, CHOLHDL in the last 168 hours.  Hematology Recent Labs  Lab 06/30/21 1710 07/01/21 0621  WBC 7.1 6.4  RBC 5.32 4.95  HGB 16.3 15.1  HCT 49.1 45.7  MCV 92.3 92.3  MCH 30.6 30.5  MCHC 33.2 33.0  RDW 13.2 13.2  PLT 202 213   Thyroid  Recent Labs  Lab 06/30/21 1710  TSH 2.025  FREET4 1.11    BNP Recent Labs  Lab 07/01/21 0621  BNP 189.7*    DDimer  Recent Labs  Lab 06/30/21 1710  DDIMER 1.34*     Radiology/Studies:  DG Chest 2 View  Result Date: 06/30/2021 CLINICAL DATA:  One week of palpitations new onset AFib EXAM: CHEST - 2 VIEW COMPARISON:  July 01, 2015 FINDINGS: Normal size heart. Tortuous thoracic  aorta. Chronic bronchitic lung changes. No pleural effusion or pneumothorax. Thoracic spondylosis. IMPRESSION: No active cardiopulmonary disease. Electronically Signed   By: Dahlia Bailiff M.D.   On: 06/30/2021 17:51   CT Angio Chest PE W and/or Wo Contrast  Result Date: 06/30/2021 CLINICAL DATA:  Elevated D-dimer level, cardiac palpitations, new onset atrial fibrillation. EXAM: CT ANGIOGRAPHY CHEST WITH CONTRAST TECHNIQUE: Multidetector CT imaging of the chest was performed using the standard protocol during bolus administration of intravenous contrast. Multiplanar CT image reconstructions and MIPs were obtained to evaluate the vascular anatomy. CONTRAST:  163mL OMNIPAQUE IOHEXOL 350 MG/ML SOLN COMPARISON:  Overlapping portions CT abdomen 10/22/2018 FINDINGS: Cardiovascular: No filling defect is identified in the pulmonary arterial tree to suggest pulmonary embolus. Aortic arch and left anterior descending coronary artery atherosclerotic calcifications. Upper normal heart size. Mediastinum/Nodes: Small type 1 hiatal hernia. No pathologic adenopathy in the chest. Lungs/Pleura: Unremarkable Upper Abdomen: Multiple gallstones in the gallbladder measuring up to about 1.2 cm in diameter. Exophytic right kidney upper pole fluid density lesion favoring cyst although only partially included on today's exam. Mild bilateral perirenal stranding appears roughly symmetric. Musculoskeletal: Thoracic spondylosis. Review of the MIP images confirms the above findings. IMPRESSION: 1. No filling defect is identified in the pulmonary arterial tree to suggest pulmonary embolus. 2. Other imaging findings of potential clinical significance: Aortic Atherosclerosis (ICD10-I70.0). Coronary atherosclerosis. Small type 1 hiatal hernia. Cholelithiasis. Thoracic spondylosis. Electronically Signed   By: Van Clines M.D.   On: 06/30/2021 20:32     Assessment and Plan:   #Afib with RVR: CHADs-vasc 3. Newly diagnosed. Initial HR in  160s that improved with initiation of dilt gtt and currently 80-110s. TSH normal. TTE on my review with normal BiV function, trivial-to-mid AR, normal RAP. -Transition from dilt gtt to dilt 240mg  daily -Start dilt 30mg  TID prn for breakthrough -Change from heparin gtt to apixaban 5mg  BID for [facility] -Follow-up TTE; per my review normal BiV function, mild AI -If HR controlled on PO CCB, can discharge home with either TEE/DCCV vs DCCV in 3weeks  #HTN: Holding home meds while on dilt  #Coronary Ca: #HLD: -Resume crestor 20mg  daily   Risk Assessment/Risk Scores:          CHA2DS2-VASc Score = 3  :1} This indicates a 3.2% annual risk of stroke. The patient's score is  based upon: CHF History: 0 HTN History: 1 Diabetes History: 0 Stroke History: 0 Vascular Disease History: 1 Age Score: 1 Gender Score: 0        For questions or updates, please contact [facility] Please consult [email or web address] for contact info under    Signed, Freada Bergeron, MD  07/01/2021 12:13 PM

## 2021-07-02 DIAGNOSIS — E876 Hypokalemia: Secondary | ICD-10-CM | POA: Diagnosis not present

## 2021-07-02 DIAGNOSIS — I4891 Unspecified atrial fibrillation: Secondary | ICD-10-CM | POA: Diagnosis not present

## 2021-07-02 DIAGNOSIS — I10 Essential (primary) hypertension: Secondary | ICD-10-CM

## 2021-07-02 DIAGNOSIS — E785 Hyperlipidemia, unspecified: Secondary | ICD-10-CM | POA: Diagnosis not present

## 2021-07-02 LAB — CBC WITH DIFFERENTIAL/PLATELET
Abs Immature Granulocytes: 0.02 10*3/uL (ref 0.00–0.07)
Basophils Absolute: 0 10*3/uL (ref 0.0–0.1)
Basophils Relative: 0 %
Eosinophils Absolute: 0 10*3/uL (ref 0.0–0.5)
Eosinophils Relative: 1 %
HCT: 45.1 % (ref 39.0–52.0)
Hemoglobin: 15 g/dL (ref 13.0–17.0)
Immature Granulocytes: 0 %
Lymphocytes Relative: 26 %
Lymphs Abs: 1.4 10*3/uL (ref 0.7–4.0)
MCH: 31.2 pg (ref 26.0–34.0)
MCHC: 33.3 g/dL (ref 30.0–36.0)
MCV: 93.8 fL (ref 80.0–100.0)
Monocytes Absolute: 0.7 10*3/uL (ref 0.1–1.0)
Monocytes Relative: 12 %
Neutro Abs: 3.3 10*3/uL (ref 1.7–7.7)
Neutrophils Relative %: 61 %
Platelets: 177 10*3/uL (ref 150–400)
RBC: 4.81 MIL/uL (ref 4.22–5.81)
RDW: 13.2 % (ref 11.5–15.5)
WBC: 5.5 10*3/uL (ref 4.0–10.5)
nRBC: 0 % (ref 0.0–0.2)

## 2021-07-02 LAB — COMPREHENSIVE METABOLIC PANEL
ALT: 11 U/L (ref 0–44)
AST: 11 U/L — ABNORMAL LOW (ref 15–41)
Albumin: 3.4 g/dL — ABNORMAL LOW (ref 3.5–5.0)
Alkaline Phosphatase: 39 U/L (ref 38–126)
Anion gap: 4 — ABNORMAL LOW (ref 5–15)
BUN: 16 mg/dL (ref 8–23)
CO2: 27 mmol/L (ref 22–32)
Calcium: 8.7 mg/dL — ABNORMAL LOW (ref 8.9–10.3)
Chloride: 106 mmol/L (ref 98–111)
Creatinine, Ser: 1.18 mg/dL (ref 0.61–1.24)
GFR, Estimated: >60 mL/min (ref >60)
Glucose, Bld: 112 mg/dL — ABNORMAL HIGH (ref 70–99)
Potassium: 4.5 mmol/L (ref 3.5–5.1)
Sodium: 137 mmol/L (ref 135–145)
Total Bilirubin: 1.2 mg/dL (ref 0.3–1.2)
Total Protein: 5.2 g/dL — ABNORMAL LOW (ref 6.5–8.1)

## 2021-07-02 LAB — PHOSPHORUS: Phosphorus: 3.7 mg/dL (ref 2.5–4.6)

## 2021-07-02 LAB — MAGNESIUM: Magnesium: 2.1 mg/dL (ref 1.7–2.4)

## 2021-07-02 MED ORDER — DILTIAZEM HCL ER COATED BEADS 240 MG PO CP24
240.0000 mg | ORAL_CAPSULE | Freq: Every day | ORAL | 0 refills | Status: DC
Start: 2021-07-03 — End: 2021-07-27

## 2021-07-02 MED ORDER — DILTIAZEM HCL 30 MG PO TABS: 30.0000 mg | ORAL_TABLET | Freq: Three times a day (TID) | ORAL | 0 refills | Status: DC | PRN

## 2021-07-02 MED ORDER — APIXABAN 5 MG PO TABS: 5.0000 mg | ORAL_TABLET | Freq: Two times a day (BID) | ORAL | 0 refills | Status: DC

## 2021-07-02 MED ORDER — ACETAMINOPHEN 325 MG PO TABS: 650.0000 mg | ORAL_TABLET | Freq: Four times a day (QID) | ORAL | 0 refills | Status: DC | PRN

## 2021-07-02 NOTE — Care Management CC44 (Signed)
Condition Code 44 Documentation Completed  Patient Details  Name: Albert [name] MRN: [id number] Date of Birth: 1951/05/11   Condition Code 44 given:  Yes Patient signature on Condition Code 44 notice:  Yes Documentation of 2 MD's agreement:  Yes Code 44 added to claim:  Yes    Darrold Span, RN 07/02/2021, 12:46 PM

## 2021-07-02 NOTE — Care Management Obs Status (Signed)
MEDICARE OBSERVATION STATUS NOTIFICATION   Patient Details  Name: Albert [name] MRN: [id number] Date of Birth: [birth year]   Medicare Observation Status Notification Given:  Yes    Darrold Span, RN 07/02/2021, 12:46 PM

## 2021-07-02 NOTE — Discharge Summary (Signed)
Physician Discharge Summary  Albert [name] ZOX:[id number] DOB: [birth year] DOA: 06/30/2021  PCP: Bailey Mech, PA-C  Admit date: 06/30/2021 Discharge date: 07/02/2021  Admitted From: Home Disposition:  Home  Recommendations for Outpatient Follow-up:  Follow up with PCP in 1-2 weeks Follow-up with cardiology A. fib clinic within 1 to 2 weeks Please obtain CMP/CBC, Mag, Phos in one week Please follow up on the following pending results:  Home Health: No  Equipment/Devices: None    Discharge Condition: Stable  CODE STATUS: FULL CODE  Diet recommendation: Heart Healthy Diet   Brief/Interim Summary: The patient is a 70 year old with a past medical history significant for but not limited to hypertension, hyperlipidemia as well as other comorbidities who was admitted to [facility] on 06/30/2021 via med [facility] transfer for atrial fibrillation with RVR.  Patient reported experiencing new palpitations last week or so and denies chest pain, diaphoresis, dizziness or presyncope or syncope.  States that he did feel lightheaded when he would bend over and just attributed to being dehydrated.  He had no new onset peripheral edema and denies any history of DVT or PE.  He recently traveled to the beach and is fairly active usually and over last several days when he is working out at the [facility] the cardiac monitor read "irregular rhythm" which is a change for him.  He presented to his PCP office yesterday at which time it demonstrated A. fib with RVR and so he was sent to [facility].  In the ED he was noted to be A. fib with RVR with a ventricular rate of 146 and nonspecific T wave inversion in aVL.  Chest x-ray was done and showed no acute cardiopulmonary process and there is no evidence of infiltrate, edema or pneumothorax.  D-dimer was elevated slightly so CT of the chest was done and showed no acute PE and there is no evidence of acute pulmonary pathology or infiltrate, edema  effusion or pneumothorax.  The ED he was initiated on diltiazem and heparin drip and transferred to [facility] for new onset A. fib with RVR.  The patient was placed on a diltiazem drip and anticoagulation with heparin drip and then changed to p.o. diltiazem and heparin was changed to p.o. apixaban given rate control.  Cardiology was consulted and made further recommendations and will get him in with A. fib clinic soon.  They are considering DCCV TEE cardioversion in 3 weeks.  Patient was given medically stable to be discharged at this time will need follow-up with PCP and cardiology in outpatient setting.  Discharge Diagnoses:  Principal Problem:   Atrial fibrillation with RVR (HCC) Active Problems:   Palpitations   Hypokalemia   Hypertension   Hyperlipidemia  A Fib With RVR -Presented with A. fib with RVR in the 160s and has had been having palpitations for the last week or so -No chest pain and was initiated on diltiazem drip with improvement in his rate -EKG confirmed A. fib with RVR and showed nonspecific T wave inversion in aVL -CHA2DS2-VASc score of 3 and he was initiated on anticoagulation with a heparin drip and will be changed to p.o. apixaban -Check TSH and echocardiogram; echocardiogram as below TSH was relatively normal -Patient remains on a diltiazem drip and is being transitioned from adult drip to diltiazem 240 mg CD daily as well as being initiated on diltiazem 30 mg p.o. 3 times daily as needed for breakthrough -Cardiology was consulted for further evaluation recommendations and cardiology  is transition the patient to p.o. Cardizem.  Patient is on 240 mg of Cardizem CD and on diltiazem 30 mg p.o. 3 times daily as needed for regular -BNP was mildly elevated 189.7 but appears euvolemic -Given that the patient was stable cardiology felt the patient can be safely discharged home and follow-up in A. fib clinic  Hypokalemia -Mild at 3.4 improved after repletion it was 4.5 at the  time of discharge -Check magnesium level in the a.m. -Continue to monitor CMP   Essential hypertension -He takes lisinopril hydrochlorothiazide in outpatient setting -Currently initiated on diltiazem drip -His HCTZ has been held currently in his lisinopril has been resumed but now discontinued given his lower blood pressures -Cardiology is following and recommending change to p.o. Cardizem CD with breakthrough Cardizem 30 mg 3 times daily as needed   Hyperlipidemia -Continue statin   Hyperglycemia -Check hemoglobin A1c in the outpatient setting -Continue to monitor blood sugars per protocol and if necessary place on sensitive NovoLog/scale insulin before meals and at bedtime  Discharge Instructions  Discharge Instructions     Amb Referral to AFIB Clinic   Complete by: As directed    Call MD for:  difficulty breathing, headache or visual disturbances   Complete by: As directed    Call MD for:  extreme fatigue   Complete by: As directed    Call MD for:  hives   Complete by: As directed    Call MD for:  persistant dizziness or light-headedness   Complete by: As directed    Call MD for:  persistant nausea and vomiting   Complete by: As directed    Call MD for:  redness, tenderness, or signs of infection (pain, swelling, redness, odor or green/yellow discharge around incision site)   Complete by: As directed    Call MD for:  severe uncontrolled pain   Complete by: As directed    Call MD for:  temperature >100.4   Complete by: As directed    Diet - low sodium heart healthy   Complete by: As directed    Discharge instructions   Complete by: As directed    You were cared for by a hospitalist during your hospital stay. If you have any questions about your discharge medications or the care you received while you were in the hospital after you are discharged, you can call the unit and ask to speak with the hospitalist on call if the hospitalist that took care of you is not available.  Once you are discharged, your primary care physician will handle any further medical issues. Please note that NO REFILLS for any discharge medications will be authorized once you are discharged, as it is imperative that you return to your primary care physician (or establish a relationship with a primary care physician if you do not have one) for your aftercare needs so that they can reassess your need for medications and monitor your lab values.  Follow up with PCP and Cardiology within 1-2 weeks. Take all medications as prescribed. If symptoms change or worsen please return to the ED for evaluation   Increase activity slowly   Complete by: As directed       Allergies as of 07/02/2021   No Known Allergies      Medication List     STOP taking these medications    hydrochlorothiazide 25 MG tablet Commonly known as: HYDRODIURIL   lisinopril 40 MG tablet Commonly known as: ZESTRIL       TAKE  these medications    acetaminophen 325 MG tablet Commonly known as: TYLENOL Take 2 tablets (650 mg total) by mouth every 6 (six) hours as needed for mild pain (or Fever >/= 101).   apixaban 5 MG Tabs tablet Commonly known as: ELIQUIS Take 1 tablet (5 mg total) by mouth 2 (two) times daily.   B-12 PO Take 1 tablet by mouth daily.   Cholecalciferol 50 MCG (2000 UT) Tabs Take 2,000 Units by mouth daily.   diltiazem 240 MG 24 hr capsule Commonly known as: CARDIZEM CD Take 1 capsule (240 mg total) by mouth daily. Start taking on: July 03, 2021   diltiazem 30 MG tablet Commonly known as: CARDIZEM Take 1 tablet (30 mg total) by mouth every 8 (eight) hours as needed (HR>110).   rosuvastatin 10 MG tablet Commonly known as: CRESTOR Take 10 mg by mouth at bedtime.        Follow-up Information     [facility] Follow up.   Specialty: Cardiology Why: The clinic will call you for a follow up appt in 2-3 business days. Contact information: [address] [id number] mc [place] [address] [phone]               No Known Allergies  Consultations: Cardiology  Procedures/Studies: DG Chest 2 View  Result Date: 06/30/2021 CLINICAL DATA:  One week of palpitations new onset AFib EXAM: CHEST - 2 VIEW COMPARISON:  July 01, 2015 FINDINGS: Normal size heart. Tortuous thoracic aorta. Chronic bronchitic lung changes. No pleural effusion or pneumothorax. Thoracic spondylosis. IMPRESSION: No active cardiopulmonary disease. Electronically Signed   By: Maudry Mayhew M.D.   On: 06/30/2021 17:51   CT Angio Chest PE W and/or Wo Contrast  Result Date: 06/30/2021 CLINICAL DATA:  Elevated D-dimer level, cardiac palpitations, new onset atrial fibrillation. EXAM: CT ANGIOGRAPHY CHEST WITH CONTRAST TECHNIQUE: Multidetector CT imaging of the chest was performed using the standard protocol during bolus administration of intravenous contrast. Multiplanar CT image reconstructions and MIPs were obtained to evaluate the vascular anatomy. CONTRAST:  OMNIPAQUE IOHEXOL 350 MG/ML SOLN COMPARISON:  Overlapping portions CT abdomen 10/22/2018 FINDINGS: Cardiovascular: No filling defect is identified in the pulmonary arterial tree to suggest pulmonary embolus. Aortic arch and left anterior descending coronary artery atherosclerotic calcifications. Upper normal heart size. Mediastinum/Nodes: Small type 1 hiatal hernia. No pathologic adenopathy in the chest. Lungs/Pleura: Unremarkable Upper Abdomen: Multiple gallstones in the gallbladder measuring up to about 1.2 cm in diameter. Exophytic right kidney upper pole fluid density lesion favoring cyst although only partially included on today's exam. Mild bilateral perirenal stranding appears roughly symmetric. Musculoskeletal: Thoracic spondylosis. Review of the MIP images confirms the above findings. IMPRESSION: 1. No filling defect is identified in the pulmonary arterial tree to suggest pulmonary  embolus. 2. Other imaging findings of potential clinical significance: Aortic Atherosclerosis (ICD10-I70.0). Coronary atherosclerosis. Small type 1 hiatal hernia. Cholelithiasis. Thoracic spondylosis. Electronically Signed   By: Gaylyn Rong M.D.   On: 06/30/2021 20:32   ECHOCARDIOGRAM COMPLETE  Result Date: 07/01/2021    ECHOCARDIOGRAM REPORT   Patient Name:   Albert [name] Date of Exam: 07/01/2021 Medical Rec #:  [id number]        Height:       72.0 in Accession #:    [id number]       Weight:       191.1 lb Date of Birth:  24-Jun-1951        BSA:  2.090 m Patient Age:    70 years         BP:           96/79 mmHg Patient Gender: M                HR:           62 bpm. Exam Location:  Inpatient Procedure: 2D Echo, Cardiac Doppler and Color Doppler Indications:    Afib  History:        Patient has no prior history of Echocardiogram examinations.                 Arrythmias:Atrial Fibrillation; Risk Factors:Hypertension.  Sonographer:    Cleatis Polka Referring Phys: [id number] JUSTIN B HOWERTER IMPRESSIONS  1. Left ventricular ejection fraction, by estimation, is 60 to 65%. The left ventricle has normal function. The left ventricle has no regional wall motion abnormalities. Left ventricular diastolic parameters are indeterminate.  2. Right ventricular systolic function is normal. The right ventricular size is normal. Tricuspid regurgitation signal is inadequate for assessing PA pressure.  3. Left atrial size was mildly dilated.  4. The mitral valve is normal in structure. No evidence of mitral valve regurgitation.  5. The aortic valve is normal in structure. Aortic valve regurgitation is mild. mild focal calcification.  6. The inferior vena cava is dilated in size with >50% respiratory variability, suggesting right atrial pressure of 8 mmHg. Conclusion(s)/Recommendation(s): Otherwise normal echocardiogram, with minor abnormalities described in the report. FINDINGS  Left Ventricle: Left ventricular  ejection fraction, by estimation, is 60 to 65%. The left ventricle has normal function. The left ventricle has no regional wall motion abnormalities. The left ventricular internal cavity size was normal in size. There is  no left ventricular hypertrophy. Left ventricular diastolic parameters are indeterminate. Right Ventricle: The right ventricular size is normal. No increase in right ventricular wall thickness. Right ventricular systolic function is normal. Tricuspid regurgitation signal is inadequate for assessing PA pressure. Left Atrium: Left atrial size was mildly dilated. Right Atrium: Right atrial size was normal in size. Pericardium: There is no evidence of pericardial effusion. Mitral Valve: The mitral valve is normal in structure. No evidence of mitral valve regurgitation. Tricuspid Valve: The tricuspid valve is normal in structure. Tricuspid valve regurgitation is not demonstrated. Aortic Valve: The aortic valve is normal in structure. Aortic valve regurgitation is mild. Aortic regurgitation PHT measures 417 msec. Mild focal calcification. Aortic valve peak gradient measures 8.8 mmHg. Pulmonic Valve: The pulmonic valve was normal in structure. Pulmonic valve regurgitation is not visualized. Aorta: The aortic root and ascending aorta are structurally normal, with no evidence of dilitation. Venous: The inferior vena cava is dilated in size with greater than 50% respiratory variability, suggesting right atrial pressure of 8 mmHg. IAS/Shunts: No atrial level shunt detected by color flow Doppler.  LEFT VENTRICLE PLAX 2D LVIDd:         4.50 cm      Diastology LVIDs:         3.00 cm      LV e' medial:    10.40 cm/s LV PW:         1.00 cm      LV E/e' medial:  11.3 LV IVS:        1.20 cm      LV e' lateral:   15.70 cm/s LVOT diam:     2.20 cm      LV E/e' lateral: 7.5 LV SV:  86 LV SV Index:   41 LVOT Area:     3.80 cm  LV Volumes (MOD) LV vol d, MOD A2C: 89.1 ml LV vol d, MOD A4C: 109.0 ml LV vol s, MOD  A2C: 39.4 ml LV vol s, MOD A4C: 38.4 ml LV SV MOD A2C:     49.7 ml LV SV MOD A4C:     109.0 ml LV SV MOD BP:      61.9 ml RIGHT VENTRICLE             IVC RV Basal diam:  3.40 cm     IVC diam: 2.00 cm RV Mid diam:    3.00 cm RV S prime:     12.00 cm/s TAPSE (M-mode): 2.4 cm LEFT ATRIUM             Index        RIGHT ATRIUM           Index LA diam:        3.90 cm 1.87 cm/m   RA Area:     15.70 cm LA Vol (A2C):   62.3 ml 29.81 ml/m  RA Volume:   36.70 ml  17.56 ml/m LA Vol (A4C):   73.3 ml 35.07 ml/m LA Biplane Vol: 69.0 ml 33.02 ml/m  AORTIC VALVE AV Area (Vmax): 3.42 cm AV Vmax:        148.00 cm/s AV Peak Grad:   8.8 mmHg LVOT Vmax:      133.00 cm/s LVOT Vmean:     97.400 cm/s LVOT VTI:       0.225 m AI PHT:         417 msec  AORTA Ao Root diam: 3.60 cm Ao Asc diam:  3.40 cm MITRAL VALVE MV Area (PHT): 4.65 cm     SHUNTS MV Decel Time: 163 msec     Systemic VTI:  0.22 m MV E velocity: 118.00 cm/s  Systemic Diam: 2.20 cm Carolan Clines Electronically signed by Carolan Clines Signature Date/Time: 07/01/2021/3:06:25 PM    Final     Subjective: Seen and examined at bedside and is doing well.  Still in A. fib but rate controlled.  No nausea or vomiting.  Felt back to his baseline.  No other concerns or complaints this time.  Discharge Exam: Vitals:   07/02/21 0556 07/02/21 1026  BP:  111/86  Pulse: 90   Resp: 18   Temp: 98.1 F (36.7 C)   SpO2: 95%    Vitals:   07/01/21 1419 07/01/21 1936 07/02/21 0556 07/02/21 1026  BP: 101/66 99/65  111/86  Pulse: 83 89 90   Resp: 18 17 18    Temp: 98.5 F (36.9 C) 98.6 F (37 C) 98.1 F (36.7 C)   TempSrc: Oral Oral Oral   SpO2: 94% 98% 95%   Weight:   87.5 kg   Height:       General: Pt is alert, awake, not in acute distress Cardiovascular: Irregularly irregular with relatively well rate controlled, S1/S2 +, no rubs, no gallops Respiratory: Diminished bilaterally, no wheezing, no rhonchi; unlabored breathing Abdominal: Soft, NT, distended secondary body  habitus, bowel sounds + Extremities: Trace edema, no cyanosis  The results of significant diagnostics from this hospitalization (including imaging, microbiology, ancillary and laboratory) are listed below for reference.    Microbiology: Recent Results (from the past 240 hour(s))  Resp Panel by RT-PCR (Flu A&B, Covid) Nasopharyngeal Swab     Status: None   Collection Time: 06/30/21  9:44  PM   Specimen: Nasopharyngeal Swab; Nasopharyngeal(NP) swabs in vial transport medium  Result Value Ref Range Status   SARS Coronavirus 2 by RT PCR NEGATIVE NEGATIVE Final    Comment: (NOTE) SARS-CoV-2 target nucleic acids are NOT DETECTED.  The SARS-CoV-2 RNA is generally detectable in upper respiratory specimens during the acute phase of infection. The lowest concentration of SARS-CoV-2 viral copies this assay can detect is 138 copies/mL. A negative result does not preclude SARS-Cov-2 infection and should not be used as the sole basis for treatment or other patient management decisions. A negative result may occur with  improper specimen collection/handling, submission of specimen other than nasopharyngeal swab, presence of viral mutation(s) within the areas targeted by this assay, and inadequate number of viral copies(<138 copies/mL). A negative result must be combined with clinical observations, patient history, and epidemiological information. The expected result is Negative.  Fact Sheet for Patients:  BloggerCourse.com  Fact Sheet for Healthcare Providers:  SeriousBroker.it  This test is no t yet approved or cleared by the [place] FDA and  has been authorized for detection and/or diagnosis of SARS-CoV-2 by FDA under an Emergency Use Authorization (EUA). This EUA will remain  in effect (meaning this test can be used) for the duration of the COVID-19 declaration under Section 564(b)(1) of the Act, 21 U.S.C.section 360bbb-3(b)(1), unless  the authorization is terminated  or revoked sooner.       Influenza A by PCR NEGATIVE NEGATIVE Final   Influenza B by PCR NEGATIVE NEGATIVE Final    Comment: (NOTE) The Xpert Xpress SARS-CoV-2/FLU/RSV plus assay is intended as an aid in the diagnosis of influenza from Nasopharyngeal swab specimens and should not be used as a sole basis for treatment. Nasal washings and aspirates are unacceptable for Xpert Xpress SARS-CoV-2/FLU/RSV testing.  Fact Sheet for Patients: BloggerCourse.com  Fact Sheet for Healthcare Providers: SeriousBroker.it  This test is not yet approved or cleared by the [place] FDA and has been authorized for detection and/or diagnosis of SARS-CoV-2 by FDA under an Emergency Use Authorization (EUA). This EUA will remain in effect (meaning this test can be used) for the duration of the COVID-19 declaration under Section 564(b)(1) of the Act, 21 U.S.C. section 360bbb-3(b)(1), unless the authorization is terminated or revoked.  Performed at [facility], [address] [place], [place] [address]     Labs: BNP (last 3 results) Recent Labs    07/01/21 0621  BNP 189.7*   Basic Metabolic Panel: Recent Labs  Lab 06/30/21 1710 07/01/21 0621 07/02/21 0344  NA 137 139 137  K 3.4* 4.0 4.5  CL 101 107 106  CO2 24 25 27   GLUCOSE 125* 111* 112*  BUN 23 12 16   CREATININE 1.22 1.11 1.18  CALCIUM 9.2 8.9 8.7*  MG  --  2.1 2.1  PHOS  --   --  3.7   Liver Function Tests: Recent Labs  Lab 06/30/21 1710 07/01/21 0621 07/02/21 0344  AST 17 13* 11*  ALT 14 12 11   ALKPHOS 55 46 39  BILITOT 1.3* 1.1 1.2  PROT 7.1 5.9* 5.2*  ALBUMIN 4.7 3.7 3.4*   No results for input(s): LIPASE, AMYLASE in the last 168 hours. No results for input(s): AMMONIA in the last 168 hours. CBC: Recent Labs  Lab 06/30/21 1710 07/01/21 0621 07/02/21 0344  WBC 7.1 6.4 5.5  NEUTROABS 5.2  --  3.3  HGB 16.3 15.1  15.0  HCT 49.1 45.7 45.1  MCV 92.3 92.3 93.8  PLT 202 213 177   Cardiac Enzymes: No results for input(s): CKTOTAL, CKMB, CKMBINDEX, TROPONINI in the last 168 hours. BNP: Invalid input(s): POCBNP CBG: No results for input(s): GLUCAP in the last 168 hours. D-Dimer Recent Labs    06/30/21 1710  DDIMER 1.34*   Hgb A1c No results for input(s): HGBA1C in the last 72 hours. Lipid Profile No results for input(s): CHOL, HDL, LDLCALC, TRIG, CHOLHDL, LDLDIRECT in the last 72 hours. Thyroid function studies Recent Labs    06/30/21 1710  TSH 2.025   Anemia work up No results for input(s): VITAMINB12, FOLATE, FERRITIN, TIBC, IRON, RETICCTPCT in the last 72 hours. Urinalysis    Component Value Date/Time   COLORURINE YELLOW 07/01/2021 0152   APPEARANCEUR CLOUDY (A) 07/01/2021 0152   LABSPEC 1.043 (H) 07/01/2021 0152   PHURINE 5.0 07/01/2021 0152   GLUCOSEU NEGATIVE 07/01/2021 0152   HGBUR NEGATIVE 07/01/2021 0152   BILIRUBINUR NEGATIVE 07/01/2021 0152   KETONESUR NEGATIVE 07/01/2021 0152   PROTEINUR NEGATIVE 07/01/2021 0152   NITRITE NEGATIVE 07/01/2021 0152   LEUKOCYTESUR NEGATIVE 07/01/2021 0152   Sepsis Labs Invalid input(s): PROCALCITONIN,  WBC,  LACTICIDVEN Microbiology Recent Results (from the past 240 hour(s))  Resp Panel by RT-PCR (Flu A&B, Covid) Nasopharyngeal Swab     Status: None   Collection Time: 06/30/21  9:44 PM   Specimen: Nasopharyngeal Swab; Nasopharyngeal(NP) swabs in vial transport medium  Result Value Ref Range Status   SARS Coronavirus 2 by RT PCR NEGATIVE NEGATIVE Final    Comment: (NOTE) SARS-CoV-2 target nucleic acids are NOT DETECTED.  The SARS-CoV-2 RNA is generally detectable in upper respiratory specimens during the acute phase of infection. The lowest concentration of SARS-CoV-2 viral copies this assay can detect is 138 copies/mL. A negative result does not preclude SARS-Cov-2 infection and should not be used as the sole basis for treatment  or other patient management decisions. A negative result may occur with  improper specimen collection/handling, submission of specimen other than nasopharyngeal swab, presence of viral mutation(s) within the areas targeted by this assay, and inadequate number of viral copies(<138 copies/mL). A negative result must be combined with clinical observations, patient history, and epidemiological information. The expected result is Negative.  Fact Sheet for Patients:  BloggerCourse.com  Fact Sheet for Healthcare Providers:  SeriousBroker.it  This test is no t yet approved or cleared by the [place] FDA and  has been authorized for detection and/or diagnosis of SARS-CoV-2 by FDA under an Emergency Use Authorization (EUA). This EUA will remain  in effect (meaning this test can be used) for the duration of the COVID-19 declaration under Section 564(b)(1) of the Act, 21 U.S.C.section 360bbb-3(b)(1), unless the authorization is terminated  or revoked sooner.       Influenza A by PCR NEGATIVE NEGATIVE Final   Influenza B by PCR NEGATIVE NEGATIVE Final    Comment: (NOTE) The Xpert Xpress SARS-CoV-2/FLU/RSV plus assay is intended as an aid in the diagnosis of influenza from Nasopharyngeal swab specimens and should not be used as a sole basis for treatment. Nasal washings and aspirates are unacceptable for Xpert Xpress SARS-CoV-2/FLU/RSV testing.  Fact Sheet for Patients: BloggerCourse.com  Fact Sheet for Healthcare Providers: SeriousBroker.it  This test is not yet approved or cleared by the [place] FDA and has been authorized for detection and/or diagnosis of SARS-CoV-2 by FDA under an Emergency Use Authorization (EUA). This EUA will remain in effect (meaning this test can be used) for the duration of the COVID-19 declaration under  Section 564(b)(1) of the Act, 21 U.S.C. section  360bbb-3(b)(1), unless the authorization is terminated or revoked.  Performed at [facility], [address] [place], [place] [address]    Time coordinating discharge: 35 minutes  SIGNED:  Merlene Laughter, DO [facility] 07/02/2021, 5:11 PM Pager is on AMION  If 7PM-7AM, please contact night-coverage [email or web address]

## 2021-07-02 NOTE — Progress Notes (Signed)
Progress Note  Patient Name: Albert [name] Date of Encounter: 07/02/2021  [facility] Cardiologist: None   Subjective   Feels better this morning. Intermittent "fluttering" overnight, but no chest pain or SOB  HR generally well controlled on Dilt PO ranging 70-110s  Inpatient Medications    Scheduled Meds:  apixaban  5 mg Oral BID   diltiazem  240 mg Oral Daily   rosuvastatin  10 mg Oral Daily   Continuous Infusions:  PRN Meds: acetaminophen **OR** acetaminophen, diltiazem   Vital Signs    Vitals:   07/01/21 0755 07/01/21 1419 07/01/21 1936 07/02/21 0556  BP: 127/86 101/66 99/65   Pulse: 81 83 89 90  Resp: 17 18 17 18   Temp: 97.9 F (36.6 C) 98.5 F (36.9 C) 98.6 F (37 C) 98.1 F (36.7 C)  TempSrc: Axillary Oral Oral Oral  SpO2: 97% 94% 98% 95%  Weight:    87.5 kg  Height:        Intake/Output Summary (Last 24 hours) at 07/02/2021 14/10/2020 Last data filed at 07/01/2021 1946 Gross per 24 hour  Intake 1364.26 ml  Output --  Net 1364.26 ml   Last 3 Weights 07/02/2021 07/01/2021 07/01/2021  Weight (lbs) 193 lb 191 lb 2.2 oz 191 lb 4.8 oz  Weight (kg) 87.544 kg 86.7 kg 86.773 kg      Telemetry    Afib with HR 70-110 - Personally Reviewed  ECG    No new tracing - Personally Reviewed  Physical Exam   GEN: No acute distress.   Neck: No JVD Cardiac: Irregularly, irregular. No murmurs Respiratory: Clear to auscultation bilaterally. GI: Soft, nontender, non-distended  MS: No edema; No deformity. Neuro:  Nonfocal  Psych: Normal affect   Labs    High Sensitivity Troponin:  No results for input(s): TROPONINIHS in the last 720 hours.   Chemistry Recent Labs  Lab 06/30/21 1710 07/01/21 0621  NA 137 139  K 3.4* 4.0  CL 101 107  CO2 24 25  GLUCOSE 125* 111*  BUN 23 12  CREATININE 1.22 1.11  CALCIUM 9.2 8.9  MG  --  2.1  PROT 7.1 5.9*  ALBUMIN 4.7 3.7  AST 17 13*  ALT 14 12  ALKPHOS 55 46  BILITOT 1.3* 1.1  GFRNONAA >60 >60  ANIONGAP 12  7    Lipids No results for input(s): CHOL, TRIG, HDL, LABVLDL, LDLCALC, CHOLHDL in the last 168 hours.  Hematology Recent Labs  Lab 06/30/21 1710 07/01/21 0621  WBC 7.1 6.4  RBC 5.32 4.95  HGB 16.3 15.1  HCT 49.1 45.7  MCV 92.3 92.3  MCH 30.6 30.5  MCHC 33.2 33.0  RDW 13.2 13.2  PLT 202 213   Thyroid  Recent Labs  Lab 06/30/21 1710  TSH 2.025  FREET4 1.11    BNP Recent Labs  Lab 07/01/21 0621  BNP 189.7*    DDimer  Recent Labs  Lab 06/30/21 1710  DDIMER 1.34*     Radiology    DG Chest 2 View  Result Date: 06/30/2021 CLINICAL DATA:  One week of palpitations new onset AFib EXAM: CHEST - 2 VIEW COMPARISON:  July 01, 2015 FINDINGS: Normal size heart. Tortuous thoracic aorta. Chronic bronchitic lung changes. No pleural effusion or pneumothorax. Thoracic spondylosis. IMPRESSION: No active cardiopulmonary disease. Electronically Signed   By: July 03, 2015 M.D.   On: 06/30/2021 17:51   CT Angio Chest PE W and/or Wo Contrast  Result Date: 06/30/2021 CLINICAL DATA:  Elevated D-dimer level, cardiac  palpitations, new onset atrial fibrillation. EXAM: CT ANGIOGRAPHY CHEST WITH CONTRAST TECHNIQUE: Multidetector CT imaging of the chest was performed using the standard protocol during bolus administration of intravenous contrast. Multiplanar CT image reconstructions and MIPs were obtained to evaluate the vascular anatomy. CONTRAST:  100mL OMNIPAQUE IOHEXOL 350 MG/ML SOLN COMPARISON:  Overlapping portions CT abdomen 10/22/2018 FINDINGS: Cardiovascular: No filling defect is identified in the pulmonary arterial tree to suggest pulmonary embolus. Aortic arch and left anterior descending coronary artery atherosclerotic calcifications. Upper normal heart size. Mediastinum/Nodes: Small type 1 hiatal hernia. No pathologic adenopathy in the chest. Lungs/Pleura: Unremarkable Upper Abdomen: Multiple gallstones in the gallbladder measuring up to about 1.2 cm in diameter. Exophytic right kidney  upper pole fluid density lesion favoring cyst although only partially included on today's exam. Mild bilateral perirenal stranding appears roughly symmetric. Musculoskeletal: Thoracic spondylosis. Review of the MIP images confirms the above findings. IMPRESSION: 1. No filling defect is identified in the pulmonary arterial tree to suggest pulmonary embolus. 2. Other imaging findings of potential clinical significance: Aortic Atherosclerosis (ICD10-I70.0). Coronary atherosclerosis. Small type 1 hiatal hernia. Cholelithiasis. Thoracic spondylosis. Electronically Signed   By: Gaylyn RongWalter  Liebkemann M.D.   On: 06/30/2021 20:32   ECHOCARDIOGRAM COMPLETE  Result Date: 07/01/2021    ECHOCARDIOGRAM REPORT   Patient Name:   Albert [name] Date of Exam: 07/01/2021 Medical Rec #:  [id number]        Height:       72.0 in Accession #:    [id number]       Weight:       191.1 lb Date of Birth:  04/24/1951        BSA:          2.090 m Patient Age:    [age] years         BP:           96/79 mmHg Patient Gender: M                HR:           62 bpm. Exam Location:  Inpatient Procedure: 2D Echo, Cardiac Doppler and Color Doppler Indications:    Afib  History:        Patient has no prior history of Echocardiogram examinations.                 Arrythmias:Atrial Fibrillation; Risk Factors:Hypertension.  Sonographer:    Cleatis Polkaaylor Peper Referring Phys: [id number] JUSTIN B HOWERTER IMPRESSIONS  1. Left ventricular ejection fraction, by estimation, is 60 to 65%. The left ventricle has normal function. The left ventricle has no regional wall motion abnormalities. Left ventricular diastolic parameters are indeterminate.  2. Right ventricular systolic function is normal. The right ventricular size is normal. Tricuspid regurgitation signal is inadequate for assessing PA pressure.  3. Left atrial size was mildly dilated.  4. The mitral valve is normal in structure. No evidence of mitral valve regurgitation.  5. The aortic valve is normal in structure.  Aortic valve regurgitation is mild. mild focal calcification.  6. The inferior vena cava is dilated in size with >50% respiratory variability, suggesting right atrial pressure of 8 mmHg. Conclusion(s)/Recommendation(s): Otherwise normal echocardiogram, with minor abnormalities described in the report. FINDINGS  Left Ventricle: Left ventricular ejection fraction, by estimation, is 60 to 65%. The left ventricle has normal function. The left ventricle has no regional wall motion abnormalities. The left ventricular internal cavity size was normal in size. There is  no left ventricular hypertrophy. Left ventricular  diastolic parameters are indeterminate. Right Ventricle: The right ventricular size is normal. No increase in right ventricular wall thickness. Right ventricular systolic function is normal. Tricuspid regurgitation signal is inadequate for assessing PA pressure. Left Atrium: Left atrial size was mildly dilated. Right Atrium: Right atrial size was normal in size. Pericardium: There is no evidence of pericardial effusion. Mitral Valve: The mitral valve is normal in structure. No evidence of mitral valve regurgitation. Tricuspid Valve: The tricuspid valve is normal in structure. Tricuspid valve regurgitation is not demonstrated. Aortic Valve: The aortic valve is normal in structure. Aortic valve regurgitation is mild. Aortic regurgitation PHT measures 417 msec. Mild focal calcification. Aortic valve peak gradient measures 8.8 mmHg. Pulmonic Valve: The pulmonic valve was normal in structure. Pulmonic valve regurgitation is not visualized. Aorta: The aortic root and ascending aorta are structurally normal, with no evidence of dilitation. Venous: The inferior vena cava is dilated in size with greater than 50% respiratory variability, suggesting right atrial pressure of 8 mmHg. IAS/Shunts: No atrial level shunt detected by color flow Doppler.  LEFT VENTRICLE PLAX 2D LVIDd:         4.50 cm      Diastology LVIDs:          3.00 cm      LV e' medial:    10.40 cm/s LV PW:         1.00 cm      LV E/e' medial:  11.3 LV IVS:        1.20 cm      LV e' lateral:   15.70 cm/s LVOT diam:     2.20 cm      LV E/e' lateral: 7.5 LV SV:         86 LV SV Index:   41 LVOT Area:     3.80 cm  LV Volumes (MOD) LV vol d, MOD A2C: 89.1 ml LV vol d, MOD A4C: 109.0 ml LV vol s, MOD A2C: 39.4 ml LV vol s, MOD A4C: 38.4 ml LV SV MOD A2C:     49.7 ml LV SV MOD A4C:     109.0 ml LV SV MOD BP:      61.9 ml RIGHT VENTRICLE             IVC RV Basal diam:  3.40 cm     IVC diam: 2.00 cm RV Mid diam:    3.00 cm RV S prime:     12.00 cm/s TAPSE (M-mode): 2.4 cm LEFT ATRIUM             Index        RIGHT ATRIUM           Index LA diam:        3.90 cm 1.87 cm/m   RA Area:     15.70 cm LA Vol (A2C):   62.3 ml 29.81 ml/m  RA Volume:   36.70 ml  17.56 ml/m LA Vol (A4C):   73.3 ml 35.07 ml/m LA Biplane Vol: 69.0 ml 33.02 ml/m  AORTIC VALVE AV Area (Vmax): 3.42 cm AV Vmax:        148.00 cm/s AV Peak Grad:   8.8 mmHg LVOT Vmax:      133.00 cm/s LVOT Vmean:     97.400 cm/s LVOT VTI:       0.225 m AI PHT:         417 msec  AORTA Ao Root diam: 3.60 cm Ao Asc diam:  3.40 cm MITRAL  VALVE MV Area (PHT): 4.65 cm     SHUNTS MV Decel Time: 163 msec     Systemic VTI:  0.22 m MV E velocity: 118.00 cm/s  Systemic Diam: 2.20 cm Phineas Inches Electronically signed by Phineas Inches Signature Date/Time: 07/01/2021/3:06:25 PM    Final     Cardiac Studies   TTE 07/01/21: IMPRESSIONS   1. Left ventricular ejection fraction, by estimation, is 60 to 65%. The  left ventricle has normal function. The left ventricle has no regional  wall motion abnormalities. Left ventricular diastolic parameters are  indeterminate.   2. Right ventricular systolic function is normal. The right ventricular  size is normal. Tricuspid regurgitation signal is inadequate for assessing  PA pressure.   3. Left atrial size was mildly dilated.   4. The mitral valve is normal in structure. No evidence of  mitral valve  regurgitation.   5. The aortic valve is normal in structure. Aortic valve regurgitation is  mild. mild focal calcification.   6. The inferior vena cava is dilated in size with >50% respiratory  variability, suggesting right atrial pressure of 8 mmHg.   Conclusion(s)/Recommendation(s): Otherwise normal echocardiogram, with  minor abnormalities described in the report.   CTA chest 06/30/21 Multidetector CT imaging of the chest was performed using the standard protocol during bolus administration of intravenous contrast. Multiplanar CT image reconstructions and MIPs were obtained to evaluate the vascular anatomy.   CONTRAST:  165mL OMNIPAQUE IOHEXOL 350 MG/ML SOLN   COMPARISON:  Overlapping portions CT abdomen 10/22/2018   FINDINGS: Cardiovascular: No filling defect is identified in the pulmonary arterial tree to suggest pulmonary embolus. Aortic arch and left anterior descending coronary artery atherosclerotic calcifications. Upper normal heart size.   Mediastinum/Nodes: Small type 1 hiatal hernia. No pathologic adenopathy in the chest.   Lungs/Pleura: Unremarkable   Upper Abdomen: Multiple gallstones in the gallbladder measuring up to about 1.2 cm in diameter. Exophytic right kidney upper pole fluid density lesion favoring cyst although only partially included on today's exam. Mild bilateral perirenal stranding appears roughly symmetric.   Musculoskeletal: Thoracic spondylosis.   Review of the MIP images confirms the above findings.   IMPRESSION: 1. No filling defect is identified in the pulmonary arterial tree to suggest pulmonary embolus. 2. Other imaging findings of potential clinical significance: Aortic Atherosclerosis (ICD10-I70.0). Coronary atherosclerosis. Small type 1 hiatal hernia. Cholelithiasis. Thoracic spondylosis.    Patient Profile     70 y.o. male HTN and HLD who presented with new onset Afib with RVR for which Cardiology has been  consulted.  Assessment & Plan    #Afib with RVR: CHADs-vasc 3. Newly diagnosed. Initial HR in 160s that improved with initiation of dilt gtt>PO dilt. TSH normal. TTE on my review with normal BiV function, trivial-to-mid AR, normal RAP, mild LAE. -Continue Dilt 240mg  XL daily; unable to up-titrate due to soft blood pressures -Continue dilt 30mg  TID prn for breakthrough -Continue apixaban 5mg  BID for AC -Okay to discharge home from CV perspective on current regimen and can for out-patient DCCV in 3 weeks vs TEE/DCCV -Will arrange for Afib clinic follow-up   #HTN: Holding home meds while on dilt   #Coronary Ca: #HLD: -Continue crestor 20mg  daily   [facility] will sign off.   Medication Recommendations:  Dilt 240mg  XL daily with 30mg  TID prn for breakthrough Other recommendations (labs, testing, etc):  N/A Follow up as an outpatient:  Will arrange for Afib clinic and CV follow-up and tentative plan for DCCV in 3  weeks   For questions or updates, please contact [place] Please consult [email or web address] for contact info under        Signed, Freada Bergeron, MD  07/02/2021, 6:20 AM

## 2021-07-10 ENCOUNTER — Ambulatory Visit (HOSPITAL_COMMUNITY)
Admission: RE | Admit: 2021-07-10 | Discharge: 2021-07-10 | Disposition: A | Discharge status: Home / Self Care | Payer: Medicare PPO | Source: Ambulatory Visit | Attending: Physician Assistant | Admitting: Physician Assistant

## 2021-07-10 ENCOUNTER — Encounter (HOSPITAL_COMMUNITY): Payer: Self-pay | Admitting: Physician Assistant

## 2021-07-10 ENCOUNTER — Other Ambulatory Visit: Payer: Self-pay

## 2021-07-10 VITALS — BP 142/90 | HR 118 | Ht 72.0 in | Wt 198.0 lb

## 2021-07-10 DIAGNOSIS — I10 Essential (primary) hypertension: Secondary | ICD-10-CM | POA: Insufficient documentation

## 2021-07-10 DIAGNOSIS — I480 Paroxysmal atrial fibrillation: Secondary | ICD-10-CM | POA: Insufficient documentation

## 2021-07-10 DIAGNOSIS — I4819 Other persistent atrial fibrillation: Secondary | ICD-10-CM | POA: Diagnosis not present

## 2021-07-10 DIAGNOSIS — Z7901 Long term (current) use of anticoagulants: Secondary | ICD-10-CM | POA: Insufficient documentation

## 2021-07-10 DIAGNOSIS — E785 Hyperlipidemia, unspecified: Secondary | ICD-10-CM | POA: Diagnosis not present

## 2021-07-10 DIAGNOSIS — I700 Atherosclerosis of aorta: Secondary | ICD-10-CM | POA: Insufficient documentation

## 2021-07-10 DIAGNOSIS — D6869 Other thrombophilia: Secondary | ICD-10-CM

## 2021-07-10 DIAGNOSIS — I2510 Atherosclerotic heart disease of native coronary artery without angina pectoris: Secondary | ICD-10-CM | POA: Insufficient documentation

## 2021-07-10 LAB — PHOSPHORUS: Phosphorus: 2.8 mg/dL (ref 2.5–4.6)

## 2021-07-10 LAB — CBC
HCT: 51.5 % (ref 39.0–52.0)
Hemoglobin: 16.8 g/dL (ref 13.0–17.0)
MCH: 30.7 pg (ref 26.0–34.0)
MCHC: 32.6 g/dL (ref 30.0–36.0)
MCV: 94.1 fL (ref 80.0–100.0)
Platelets: 215 10*3/uL (ref 150–400)
RBC: 5.47 MIL/uL (ref 4.22–5.81)
RDW: 13.2 % (ref 11.5–15.5)
WBC: 6 10*3/uL (ref 4.0–10.5)
nRBC: 0 % (ref 0.0–0.2)

## 2021-07-10 LAB — COMPREHENSIVE METABOLIC PANEL
ALT: 21 U/L (ref 0–44)
AST: 19 U/L (ref 15–41)
Albumin: 4.3 g/dL (ref 3.5–5.0)
Alkaline Phosphatase: 52 U/L (ref 38–126)
Anion gap: 9 (ref 5–15)
BUN: 15 mg/dL (ref 8–23)
CO2: 26 mmol/L (ref 22–32)
Calcium: 9.3 mg/dL (ref 8.9–10.3)
Chloride: 103 mmol/L (ref 98–111)
Creatinine, Ser: 1.11 mg/dL (ref 0.61–1.24)
GFR, Estimated: >60 mL/min (ref >60)
Glucose, Bld: 142 mg/dL — ABNORMAL HIGH (ref 70–99)
Potassium: 3.9 mmol/L (ref 3.5–5.1)
Sodium: 138 mmol/L (ref 135–145)
Total Bilirubin: 1.1 mg/dL (ref 0.3–1.2)
Total Protein: 6.6 g/dL (ref 6.5–8.1)

## 2021-07-10 LAB — MAGNESIUM: Magnesium: 1.9 mg/dL (ref 1.7–2.4)

## 2021-07-10 NOTE — Patient Instructions (Addendum)
Cardioversion scheduled for Thursday, December 29th  - Arrive at the [facility] and go to admitting at [facility] not eat or drink anything after midnight the night prior to your procedure.  - Take all your morning medication (except diabetic medications) with a sip of water prior to arrival.  - You will not be able to drive home after your procedure.  - Do NOT miss any doses of your blood thinner - if you should miss a dose please notify our office immediately.  - If you feel as if you go back into normal rhythm prior to scheduled cardioversion, please notify our office immediately. If your procedure is canceled in the cardioversion suite you will be charged a cancellation fee. Patients will be asked to: to mask in public and hand hygiene (no longer quarantine) in the 3 days prior to surgery, to report if any COVID-19-like illness or household contacts to COVID-19 to determine need for testing

## 2021-07-10 NOTE — Progress Notes (Signed)
Primary Care Physician: Albert Bellows, PA-C Primary Cardiologist: none Primary Electrophysiologist: none Referring Physician: Dr Albert [name] Albert is a 70 y.o. [name] with a history of HTN, HLD, CAD on CT, atrial fibrillation who presents for consultation in the [facility].  The patient was initially diagnosed with atrial fibrillation 06/30/21 after presenting to his PCP with increasing palpitations and exercise intolerance. ECG showed afib with RVR and he was sent to the ED. He was started on diltiazem for rate control and Eliquis for a CHADS2VASC score of 3. Echo showed preserved EF 60-65%, mild LAE. He denies significant alcohol use or snoring. He has had intermittent palpitations during the summer, usually after working outside.   Today, he denies symptoms of chest pain, shortness of breath, orthopnea, PND, lower extremity edema, dizziness, presyncope, syncope, snoring, daytime somnolence, bleeding, or neurologic sequela. The patient is tolerating medications without difficulties and is otherwise without complaint today.    Atrial Fibrillation Risk Factors:  he does not have symptoms or diagnosis of sleep apnea. he does not have a history of rheumatic fever. he does not have a history of alcohol use. The patient does not have a history of early familial atrial fibrillation or other arrhythmias.  he has a BMI of Body mass index is 26.85 kg/m.Marland Kitchen Filed Weights   07/10/21 0855  Weight: 89.8 kg    No family history on file.   Atrial Fibrillation Management history:  Previous antiarrhythmic drugs: none Previous cardioversions: none Previous ablations: none CHADS2VASC score: 3 Anticoagulation history: Eliquis   Past Medical History:  Diagnosis Date   Allergic rhinitis    BPH (benign prostatic hyperplasia)    GERD (gastroesophageal reflux disease)    Hyperlipidemia    Hypertension    No past surgical history on  file.  Current Outpatient Medications  Medication Sig Dispense Refill   acetaminophen (TYLENOL) 325 MG tablet Take 2 tablets (650 mg total) by mouth every 6 (six) hours as needed for mild pain (or Fever >/= 101). 20 tablet 0   apixaban (ELIQUIS) 5 MG TABS tablet Take 1 tablet (5 mg total) by mouth 2 (two) times daily. 60 tablet 0   Cholecalciferol 50 MCG (2000 UT) TABS Take 2,000 Units by mouth daily.     Cyanocobalamin (B-12 PO) Take 1 tablet by mouth daily.     diltiazem (CARDIZEM CD) 240 MG 24 hr capsule Take 1 capsule (240 mg total) by mouth daily. 30 capsule 0   diltiazem (CARDIZEM) 30 MG tablet Take 1 tablet (30 mg total) by mouth every 8 (eight) hours as needed (HR>110). 90 tablet 0   rosuvastatin (CRESTOR) 10 MG tablet Take 10 mg by mouth at bedtime.     No current facility-administered medications for this encounter.    No Known Allergies  Social History   Socioeconomic History   Marital status: Single    Spouse name: Not on file   Number of children: Not on file   Years of education: Not on file   Highest education level: Not on file  Occupational History   Not on file  Tobacco Use   Smoking status: Never   Smokeless tobacco: Never  Substance and Sexual Activity   Alcohol use: Not Currently   Drug use: Not Currently   Sexual activity: Not on file  Other Topics Concern   Not on file  Social History Narrative   Not on file   Social Determinants of Health   Financial  Resource Strain: Not on file  Food Insecurity: Not on file  Transportation Needs: Not on file  Physical Activity: Not on file  Stress: Not on file  Social Connections: Not on file  Intimate Partner Violence: Not on file     ROS- All systems are reviewed and negative except as per the HPI above.  Physical Exam: Vitals:   07/10/21 0855  BP: (!) 142/90  Pulse: (!) 118  Weight: 89.8 kg  Height: 6' (1.829 m)    GEN- The patient is a well appearing [name], alert and oriented x 3 today.    Head- normocephalic, atraumatic Eyes-  Sclera clear, conjunctiva pink Ears- hearing intact Oropharynx- clear Neck- supple  Lungs- Clear to ausculation bilaterally, normal work of breathing Heart- irregular rate and rhythm, no murmurs, rubs or gallops  GI- soft, NT, ND, + BS Extremities- no clubbing, cyanosis, or edema MS- no significant deformity or atrophy Skin- no rash or lesion Psych- euthymic mood, full affect Neuro- strength and sensation are intact  Wt Readings from Last 3 Encounters:  07/10/21 89.8 kg  07/02/21 87.5 kg    EKG today demonstrates  Coarse afib, PVC Vent. rate 118 BPM PR interval * ms QRS duration 94 ms QT/QTcB 288/403 ms  Echo 07/01/21 demonstrated  1. Left ventricular ejection fraction, by estimation, is 60 to 65%. The  left ventricle has normal function. The left ventricle has no regional  wall motion abnormalities. Left ventricular diastolic parameters are  indeterminate.   2. Right ventricular systolic function is normal. The right ventricular  size is normal. Tricuspid regurgitation signal is inadequate for assessing PA pressure.   3. Left atrial size was mildly dilated.   4. The mitral valve is normal in structure. No evidence of mitral valve  regurgitation.   5. The aortic valve is normal in structure. Aortic valve regurgitation is  mild. mild focal calcification.   6. The inferior vena cava is dilated in size with >50% respiratory  variability, suggesting right atrial pressure of 8 mmHg.   Conclusion(s)/Recommendation(s): Otherwise normal echocardiogram, with minor abnormalities described in the report.   Epic records are reviewed at length today  CHA2DS2-VASc Score = 3  The patient's score is based upon: CHF History: 0 HTN History: 1 Diabetes History: 0 Stroke History: 0 Vascular Disease History: 1 Age Score: 1 Gender Score: 0       ASSESSMENT AND PLAN: 1. Persistent Atrial Fibrillation (ICD10:  I48.19) The patient's  CHA2DS2-VASc score is 3, indicating a 3.2% annual risk of stroke.   General education about afib provided and questions answered. We also discussed his stroke risk and the risks and benefits of anticoagulation. Will plan for DCCV after 3 weeks of uninterrupted anticoagulation. (12/24) Continue Eliquis 5 mg BID Continue diltiazem 240 mg daily with 30 mg PRN q 4 hours for heart racing. His heart rates at home have been 70s-90s. Heart rate here improved on recheck. Will not increase rate control for now.   2. Secondary Hypercoagulable State (ICD10:  D68.69) The patient is at significant risk for stroke/thromboembolism based upon his CHA2DS2-VASc Score of 3.  Continue Apixaban (Eliquis).   3. HTN Stable, no changes today.  4. Aortic atherosclerosis/CAD Noted on CT No anginal symptoms. On rosuvastatin    Follow up in the AF clinic post DCCV. Patient also requesting to establish care with Dr Ellyn Hack as an outpatient. Will refer.    [facility] [address] [place], [place] [address] [phone]  07/10/2021 9:04 AM

## 2021-07-10 NOTE — H&P (View-Only) (Signed)
Primary Care Physician: Jonathon Bellows, PA-C Primary Cardiologist: none Primary Electrophysiologist: none Referring Physician: Dr Little Ishikawa Albert [name] is a 70 y.o. male with a history of HTN, HLD, CAD on CT, atrial fibrillation who presents for consultation in the [facility].  The patient was initially diagnosed with atrial fibrillation 06/30/21 after presenting to his PCP with increasing palpitations and exercise intolerance. ECG showed afib with RVR and he was sent to the ED. He was started on diltiazem for rate control and Eliquis for a CHADS2VASC score of 3. Echo showed preserved EF 60-65%, mild LAE. He denies significant alcohol use or snoring. He has had intermittent palpitations during the summer, usually after working outside.   Today, he denies symptoms of chest pain, shortness of breath, orthopnea, PND, lower extremity edema, dizziness, presyncope, syncope, snoring, daytime somnolence, bleeding, or neurologic sequela. The patient is tolerating medications without difficulties and is otherwise without complaint today.    Atrial Fibrillation Risk Factors:  he does not have symptoms or diagnosis of sleep apnea. he does not have a history of rheumatic fever. he does not have a history of alcohol use. The patient does not have a history of early familial atrial fibrillation or other arrhythmias.  he has a BMI of Body mass index is 26.85 kg/m.Marland Kitchen Filed Weights   07/10/21 0855  Weight: 89.8 kg    No family history on file.   Atrial Fibrillation Management history:  Previous antiarrhythmic drugs: none Previous cardioversions: none Previous ablations: none CHADS2VASC score: 3 Anticoagulation history: Eliquis   Past Medical History:  Diagnosis Date   Allergic rhinitis    BPH (benign prostatic hyperplasia)    GERD (gastroesophageal reflux disease)    Hyperlipidemia    Hypertension    No past surgical history on  file.  Current Outpatient Medications  Medication Sig Dispense Refill   acetaminophen (TYLENOL) 325 MG tablet Take 2 tablets (650 mg total) by mouth every 6 (six) hours as needed for mild pain (or Fever >/= 101). 20 tablet 0   apixaban (ELIQUIS) 5 MG TABS tablet Take 1 tablet (5 mg total) by mouth 2 (two) times daily. 60 tablet 0   Cholecalciferol 50 MCG (2000 UT) TABS Take 2,000 Units by mouth daily.     Cyanocobalamin (B-12 PO) Take 1 tablet by mouth daily.     diltiazem (CARDIZEM CD) 240 MG 24 hr capsule Take 1 capsule (240 mg total) by mouth daily. 30 capsule 0   diltiazem (CARDIZEM) 30 MG tablet Take 1 tablet (30 mg total) by mouth every 8 (eight) hours as needed (HR>110). 90 tablet 0   rosuvastatin (CRESTOR) 10 MG tablet Take 10 mg by mouth at bedtime.     No current facility-administered medications for this encounter.    No Known Allergies  Social History   Socioeconomic History   Marital status: Single    Spouse name: Not on file   Number of children: Not on file   Years of education: Not on file   Highest education level: Not on file  Occupational History   Not on file  Tobacco Use   Smoking status: Never   Smokeless tobacco: Never  Substance and Sexual Activity   Alcohol use: Not Currently   Drug use: Not Currently   Sexual activity: Not on file  Other Topics Concern   Not on file  Social History Narrative   Not on file   Social Determinants of Health   Financial  Resource Strain: Not on file  Food Insecurity: Not on file  Transportation Needs: Not on file  Physical Activity: Not on file  Stress: Not on file  Social Connections: Not on file  Intimate Partner Violence: Not on file     ROS- All systems are reviewed and negative except as per the HPI above.  Physical Exam: Vitals:   07/10/21 0855  BP: (!) 142/90  Pulse: (!) 118  Weight: 89.8 kg  Height: 6' (1.829 m)    GEN- The patient is a well appearing male, alert and oriented x 3 today.    Head- normocephalic, atraumatic Eyes-  Sclera clear, conjunctiva pink Ears- hearing intact Oropharynx- clear Neck- supple  Lungs- Clear to ausculation bilaterally, normal work of breathing Heart- irregular rate and rhythm, no murmurs, rubs or gallops  GI- soft, NT, ND, + BS Extremities- no clubbing, cyanosis, or edema MS- no significant deformity or atrophy Skin- no rash or lesion Psych- euthymic mood, full affect Neuro- strength and sensation are intact  Wt Readings from Last 3 Encounters:  07/10/21 89.8 kg  07/02/21 87.5 kg    EKG today demonstrates  Coarse afib, PVC Vent. rate 118 BPM PR interval * ms QRS duration 94 ms QT/QTcB 288/403 ms  Echo 07/01/21 demonstrated  1. Left ventricular ejection fraction, by estimation, is 60 to 65%. The  left ventricle has normal function. The left ventricle has no regional  wall motion abnormalities. Left ventricular diastolic parameters are  indeterminate.   2. Right ventricular systolic function is normal. The right ventricular  size is normal. Tricuspid regurgitation signal is inadequate for assessing PA pressure.   3. Left atrial size was mildly dilated.   4. The mitral valve is normal in structure. No evidence of mitral valve  regurgitation.   5. The aortic valve is normal in structure. Aortic valve regurgitation is  mild. mild focal calcification.   6. The inferior vena cava is dilated in size with >50% respiratory  variability, suggesting right atrial pressure of 8 mmHg.   Conclusion(s)/Recommendation(s): Otherwise normal echocardiogram, with minor abnormalities described in the report.   Epic records are reviewed at length today  CHA2DS2-VASc Score = 3  The patient's score is based upon: CHF History: 0 HTN History: 1 Diabetes History: 0 Stroke History: 0 Vascular Disease History: 1 Age Score: 1 Gender Score: 0       ASSESSMENT AND PLAN: 1. Persistent Atrial Fibrillation (ICD10:  I48.19) The patient's  CHA2DS2-VASc score is 3, indicating a 3.2% annual risk of stroke.   General education about afib provided and questions answered. We also discussed his stroke risk and the risks and benefits of anticoagulation. Will plan for DCCV after 3 weeks of uninterrupted anticoagulation. (12/24) Continue Eliquis 5 mg BID Continue diltiazem 240 mg daily with 30 mg PRN q 4 hours for heart racing. His heart rates at home have been 70s-90s. Heart rate here improved on recheck. Will not increase rate control for now.   2. Secondary Hypercoagulable State (ICD10:  D68.69) The patient is at significant risk for stroke/thromboembolism based upon his CHA2DS2-VASc Score of 3.  Continue Apixaban (Eliquis).   3. HTN Stable, no changes today.  4. Aortic atherosclerosis/CAD Noted on CT No anginal symptoms. On rosuvastatin    Follow up in the AF clinic post DCCV. Patient also requesting to establish care with Dr Ellyn Hack as an outpatient. Will refer.    [facility] [address] [place], [place] [address] [phone]  07/10/2021 9:04 AM

## 2021-07-14 ENCOUNTER — Encounter (HOSPITAL_COMMUNITY): Payer: Self-pay | Admitting: Internal Medicine

## 2021-07-14 NOTE — Progress Notes (Signed)
Attempted to obtain medical history via telephone, unable to reach at this time. I left a voicemail to return pre surgical testing department's phone call.  

## 2021-07-26 ENCOUNTER — Ambulatory Visit (HOSPITAL_COMMUNITY)
Admission: RE | Admit: 2021-07-26 | Discharge: 2021-07-26 | Disposition: A | Discharge status: Home / Self Care | Payer: Medicare PPO | Source: Ambulatory Visit | Attending: Nurse Practitioner | Admitting: Nurse Practitioner

## 2021-07-26 ENCOUNTER — Other Ambulatory Visit: Payer: Self-pay

## 2021-07-26 DIAGNOSIS — I4819 Other persistent atrial fibrillation: Secondary | ICD-10-CM | POA: Diagnosis not present

## 2021-07-26 LAB — BASIC METABOLIC PANEL
Anion gap: 9 (ref 5–15)
BUN: 19 mg/dL (ref 8–23)
CO2: 23 mmol/L (ref 22–32)
Calcium: 9.2 mg/dL (ref 8.9–10.3)
Chloride: 107 mmol/L (ref 98–111)
Creatinine, Ser: 1.13 mg/dL (ref 0.61–1.24)
GFR, Estimated: >60 mL/min (ref >60)
Glucose, Bld: 119 mg/dL — ABNORMAL HIGH (ref 70–99)
Potassium: 4.5 mmol/L (ref 3.5–5.1)
Sodium: 139 mmol/L (ref 135–145)

## 2021-07-26 LAB — CBC
HCT: 50.9 % (ref 39.0–52.0)
Hemoglobin: 16.7 g/dL (ref 13.0–17.0)
MCH: 30.9 pg (ref 26.0–34.0)
MCHC: 32.8 g/dL (ref 30.0–36.0)
MCV: 94.3 fL (ref 80.0–100.0)
Platelets: 197 10*3/uL (ref 150–400)
RBC: 5.4 MIL/uL (ref 4.22–5.81)
RDW: 13.4 % (ref 11.5–15.5)
WBC: 6.6 10*3/uL (ref 4.0–10.5)
nRBC: 0 % (ref 0.0–0.2)

## 2021-07-27 ENCOUNTER — Ambulatory Visit (HOSPITAL_COMMUNITY): Payer: Medicare PPO | Admitting: Anesthesiology

## 2021-07-27 ENCOUNTER — Encounter (HOSPITAL_COMMUNITY)
Admission: RE | Disposition: A | Discharge status: Home / Self Care | Payer: Self-pay | Source: Home / Self Care | Attending: Internal Medicine

## 2021-07-27 ENCOUNTER — Ambulatory Visit (HOSPITAL_COMMUNITY)
Admission: RE | Admit: 2021-07-27 | Discharge: 2021-07-27 | Disposition: A | Discharge status: Home / Self Care | Payer: Medicare PPO | Attending: Internal Medicine | Admitting: Internal Medicine

## 2021-07-27 DIAGNOSIS — E785 Hyperlipidemia, unspecified: Secondary | ICD-10-CM | POA: Insufficient documentation

## 2021-07-27 DIAGNOSIS — I4819 Other persistent atrial fibrillation: Secondary | ICD-10-CM | POA: Diagnosis not present

## 2021-07-27 DIAGNOSIS — K219 Gastro-esophageal reflux disease without esophagitis: Secondary | ICD-10-CM | POA: Diagnosis not present

## 2021-07-27 DIAGNOSIS — I10 Essential (primary) hypertension: Secondary | ICD-10-CM | POA: Diagnosis not present

## 2021-07-27 DIAGNOSIS — I2510 Atherosclerotic heart disease of native coronary artery without angina pectoris: Secondary | ICD-10-CM | POA: Insufficient documentation

## 2021-07-27 DIAGNOSIS — Z7901 Long term (current) use of anticoagulants: Secondary | ICD-10-CM | POA: Diagnosis not present

## 2021-07-27 DIAGNOSIS — I4891 Unspecified atrial fibrillation: Secondary | ICD-10-CM | POA: Diagnosis present

## 2021-07-27 DIAGNOSIS — Z79899 Other long term (current) drug therapy: Secondary | ICD-10-CM | POA: Diagnosis not present

## 2021-07-27 DIAGNOSIS — D6859 Other primary thrombophilia: Secondary | ICD-10-CM | POA: Insufficient documentation

## 2021-07-27 DIAGNOSIS — I700 Atherosclerosis of aorta: Secondary | ICD-10-CM | POA: Diagnosis not present

## 2021-07-27 HISTORY — PX: CARDIOVERSION: SHX1299

## 2021-07-27 SURGERY — CARDIOVERSION
Anesthesia: General

## 2021-07-27 MED ORDER — LACTATED RINGERS IV SOLN: INTRAVENOUS | Status: DC | PRN

## 2021-07-27 MED ORDER — APIXABAN 5 MG PO TABS: 5.0000 mg | ORAL_TABLET | Freq: Two times a day (BID) | ORAL | 11 refills | Status: DC

## 2021-07-27 MED ORDER — LIDOCAINE 2% (20 MG/ML) 5 ML SYRINGE
INTRAMUSCULAR | Status: DC | PRN
Start: 2021-07-27 — End: 2021-07-27
  Administered 2021-07-27: 40 mg via INTRAVENOUS

## 2021-07-27 MED ORDER — DILTIAZEM HCL 30 MG PO TABS: 30.0000 mg | ORAL_TABLET | Freq: Three times a day (TID) | ORAL | 1 refills | Status: DC | PRN

## 2021-07-27 MED ORDER — SODIUM CHLORIDE 0.9 % IV SOLN: INTRAVENOUS | Status: DC

## 2021-07-27 MED ORDER — PROPOFOL 10 MG/ML IV BOLUS
INTRAVENOUS | Status: DC | PRN
  Administered 2021-07-27: 100 mg via INTRAVENOUS

## 2021-07-27 MED ORDER — DILTIAZEM HCL ER COATED BEADS 240 MG PO CP24: 240.0000 mg | ORAL_CAPSULE | Freq: Every day | ORAL | 11 refills | Status: DC

## 2021-07-27 NOTE — Anesthesia Postprocedure Evaluation (Signed)
Anesthesia Post Note  Patient: Albert [name]  Procedure(s) Performed: CARDIOVERSION     Patient location during evaluation: Endoscopy Anesthesia Type: General Level of consciousness: awake and alert Pain management: pain level controlled Vital Signs Assessment: post-procedure vital signs reviewed and stable Respiratory status: spontaneous breathing, nonlabored ventilation and respiratory function stable Cardiovascular status: blood pressure returned to baseline and stable Postop Assessment: no apparent nausea or vomiting and able to ambulate Anesthetic complications: no   No notable events documented.  Last Vitals:  Vitals:   07/27/21 0920 07/27/21 0925  BP: 110/74 117/73  Pulse: 70 70  Resp: 12 16  Temp:    SpO2: 96% 96%    Last Pain:  Vitals:   07/27/21 0920  TempSrc:   PainSc: 0-No pain                 Lakota Schweppe,E. Shuree Brossart

## 2021-07-27 NOTE — CV Procedure (Signed)
° °  CARDIOVERSION NOTE  Procedure: Electrical Cardioversion Indications:  Atrial Fibrillation  Procedure Details:  Consent: Risks of procedure as well as the alternatives and risks of each were explained to the (patient/caregiver).  Consent for procedure obtained.  Time Out: Verified patient identification, verified procedure, site/side was marked, verified correct patient position, special equipment/implants available, medications/allergies/relevent history reviewed, required imaging and test results available.  Performed  Patient placed on cardiac monitor, pulse oximetry, supplemental oxygen as necessary.  Sedation given:  propofol per anesthesia Pacer pads placed anterior and posterior chest.  Cardioverted 1 time(s).  Cardioverted at 150J biphasic.  Impression: Findings: Post procedure EKG shows: NSR Complications: None Patient did tolerate procedure well.  Plan: Successful DCCV with a single 150J biphasic shock.  Time Spent Directly with the Patient:  30 minutes   [name], MD, [facility], FACP  [place]   [facility]  Medical Director of the [facility]id [facility] &  Cardiovascular Risk Reduction Clinic Diplomate of the American Board of Clinical Lipidology Attending Cardiologist  Direct Dial: [phone]   Fax: [phone]  Website:  [email or web address] Nare Gaspari 07/27/2021, 9:05 AM

## 2021-07-27 NOTE — Transfer of Care (Signed)
Immediate Anesthesia Transfer of Care Note  Patient: Albert [name]  Procedure(s) Performed: CARDIOVERSION  Patient Location: Endoscopy Unit  Anesthesia Type:General  Level of Consciousness: drowsy and patient cooperative  Airway & Oxygen Therapy: Patient Spontanous Breathing  Post-op Assessment: Report given to RN and Post -op Vital signs reviewed and stable  Post vital signs: Reviewed and stable  Last Vitals:  Vitals Value Taken Time  BP 110/66   Temp    Pulse 80   Resp 19   SpO2 99     Last Pain:  Vitals:   07/27/21 0748  TempSrc: Other (Comment)  PainSc: 0-No pain         Complications: No notable events documented.

## 2021-07-27 NOTE — Interval H&P Note (Signed)
History and Physical Interval Note:  07/27/2021 8:34 AM  Albert [name]  has presented today for surgery, with the diagnosis of A-FIB.  The various methods of treatment have been discussed with the patient and family. After consideration of risks, benefits and other options for treatment, the patient has consented to  Procedure(s): CARDIOVERSION (N/A) as a surgical intervention.  The patient's history has been reviewed, patient examined, no change in status, stable for surgery.  I have reviewed the patient's chart and labs.  Questions were answered to the patient's satisfaction.     [name]

## 2021-07-27 NOTE — Discharge Instructions (Signed)

## 2021-07-27 NOTE — Anesthesia Preprocedure Evaluation (Addendum)
Anesthesia Evaluation  Patient identified by MRN, date of birth, ID band Patient awake    Reviewed: Allergy & Precautions, NPO status , Patient's Chart, lab work & pertinent test results  History of Anesthesia Complications Negative for: history of anesthetic complications  Airway Mallampati: II  TM Distance: >3 FB Neck ROM: Full    Dental  (+) Missing, Dental Advisory Given   Pulmonary neg pulmonary ROS,    breath sounds clear to auscultation       Cardiovascular hypertension, Pt. on medications (-) angina+ dysrhythmias Atrial Fibrillation  Rhythm:Irregular Rate:Normal  07/01/2021 ECHO: EF 60-65%, normal LVF, mild AI   Neuro/Psych negative neurological ROS     GI/Hepatic Neg liver ROS, GERD  Controlled,  Endo/Other  negative endocrine ROS  Renal/GU negative Renal ROS     Musculoskeletal   Abdominal   Peds  Hematology  (+) Blood dyscrasia (eliquis), ,   Anesthesia Other Findings   Reproductive/Obstetrics                           Anesthesia Physical Anesthesia Plan  ASA: 3  Anesthesia Plan: General   Post-op Pain Management: Minimal or no pain anticipated   Induction:   PONV Risk Score and Plan: 2 and Treatment may vary due to age or medical condition, Propofol infusion and TIVA  Airway Management Planned: Natural Airway and Mask  Additional Equipment: None  Intra-op Plan:   Post-operative Plan:   Informed Consent: I have reviewed the patients History and Physical, chart, labs and discussed the procedure including the risks, benefits and alternatives for the proposed anesthesia with the patient or authorized representative who has indicated his/her understanding and acceptance.     Dental advisory given  Plan Discussed with: CRNA and Surgeon  Anesthesia Plan Comments:        Anesthesia Quick Evaluation

## 2021-07-30 ENCOUNTER — Encounter (HOSPITAL_COMMUNITY): Payer: Self-pay | Admitting: Internal Medicine

## 2021-08-03 ENCOUNTER — Encounter (HOSPITAL_COMMUNITY): Payer: Self-pay | Admitting: Physician Assistant

## 2021-08-03 ENCOUNTER — Ambulatory Visit (HOSPITAL_COMMUNITY)
Admission: RE | Admit: 2021-08-03 | Discharge: 2021-08-03 | Disposition: A | Discharge status: Home / Self Care | Payer: Medicare PPO | Source: Ambulatory Visit | Attending: Physician Assistant | Admitting: Physician Assistant

## 2021-08-03 ENCOUNTER — Other Ambulatory Visit: Payer: Self-pay

## 2021-08-03 VITALS — BP 150/80 | HR 74 | Ht 72.0 in | Wt 195.4 lb

## 2021-08-03 DIAGNOSIS — Z7901 Long term (current) use of anticoagulants: Secondary | ICD-10-CM | POA: Diagnosis not present

## 2021-08-03 DIAGNOSIS — I2510 Atherosclerotic heart disease of native coronary artery without angina pectoris: Secondary | ICD-10-CM | POA: Diagnosis not present

## 2021-08-03 DIAGNOSIS — Z79899 Other long term (current) drug therapy: Secondary | ICD-10-CM | POA: Insufficient documentation

## 2021-08-03 DIAGNOSIS — I10 Essential (primary) hypertension: Secondary | ICD-10-CM | POA: Diagnosis not present

## 2021-08-03 DIAGNOSIS — D6869 Other thrombophilia: Secondary | ICD-10-CM | POA: Diagnosis not present

## 2021-08-03 DIAGNOSIS — I4819 Other persistent atrial fibrillation: Secondary | ICD-10-CM | POA: Diagnosis not present

## 2021-08-03 DIAGNOSIS — E785 Hyperlipidemia, unspecified: Secondary | ICD-10-CM | POA: Insufficient documentation

## 2021-08-03 DIAGNOSIS — I700 Atherosclerosis of aorta: Secondary | ICD-10-CM | POA: Insufficient documentation

## 2021-08-03 NOTE — Progress Notes (Signed)
Primary Care Physician: Jonathon Bellows, PA-C Primary Cardiologist: Dr Ellyn Hack (new) Primary Electrophysiologist: none Referring Physician: Dr Little Ishikawa Albert [name] is a 71 y.o. male with a history of HTN, HLD, CAD on CT, atrial fibrillation who presents for follow up in the [facility].  The patient was initially diagnosed with atrial fibrillation 06/30/21 after presenting to his PCP with increasing palpitations and exercise intolerance. ECG showed afib with RVR and he was sent to the ED. He was started on diltiazem for rate control and Eliquis for a CHADS2VASC score of 3. Echo showed preserved EF 60-65%, mild LAE. He denies significant alcohol use or snoring.   On follow up today, patient is s/p DCCV on 07/27/21. He reports that he has felt well since the procedure with no palpitations. He does not get as winded while exercising. He denies any bleeding issues on anticoagulation.   Today, he denies symptoms of palpitations, chest pain, shortness of breath, orthopnea, PND, lower extremity edema, dizziness, presyncope, syncope, snoring, daytime somnolence, bleeding, or neurologic sequela. The patient is tolerating medications without difficulties and is otherwise without complaint today.    Atrial Fibrillation Risk Factors:  he does not have symptoms or diagnosis of sleep apnea. he does not have a history of rheumatic fever. he does not have a history of alcohol use. The patient does not have a history of early familial atrial fibrillation or other arrhythmias.  he has a BMI of Body mass index is 26.5 kg/m.Marland Kitchen Filed Weights   08/03/21 0949  Weight: 88.6 kg     No family history on file.   Atrial Fibrillation Management history:  Previous antiarrhythmic drugs: none Previous cardioversions: 07/27/21 Previous ablations: none CHADS2VASC score: 3 Anticoagulation history: Eliquis   Past Medical History:  Diagnosis Date   Allergic  rhinitis    BPH (benign prostatic hyperplasia)    GERD (gastroesophageal reflux disease)    Hyperlipidemia    Hypertension    Past Surgical History:  Procedure Laterality Date   CARDIOVERSION N/A 07/27/2021   Procedure: CARDIOVERSION;  Surgeon: Pixie Casino, MD;  Location: [facility] ENDOSCOPY;  Service: Cardiovascular;  Laterality: N/A;    Current Outpatient Medications  Medication Sig Dispense Refill   apixaban (ELIQUIS) 5 MG TABS tablet Take 1 tablet (5 mg total) by mouth 2 (two) times daily. 60 tablet 11   Calcium Carbonate-Simethicone (ROLAIDS PLUS GAS RELIEF EX ST PO) Take 2 tablets by mouth daily as needed (heartburn/gas).     Cholecalciferol 50 MCG (2000 UT) TABS Take 2,000 Units by mouth daily.     diltiazem (CARDIZEM CD) 240 MG 24 hr capsule Take 1 capsule (240 mg total) by mouth daily. 30 capsule 11   diltiazem (CARDIZEM) 30 MG tablet Take 1 tablet (30 mg total) by mouth every 8 (eight) hours as needed (HR>110). 90 tablet 1   rosuvastatin (CRESTOR) 10 MG tablet Take 10 mg by mouth at bedtime.     vitamin B-12 (CYANOCOBALAMIN) 100 MCG tablet Take 100 mcg by mouth daily.     No current facility-administered medications for this encounter.    No Known Allergies  Social History   Socioeconomic History   Marital status: Single    Spouse name: Not on file   Number of children: Not on file   Years of education: Not on file   Highest education level: Not on file  Occupational History   Not on file  Tobacco Use   Smoking status: Never  Smokeless tobacco: Never  Substance and Sexual Activity   Alcohol use: Not Currently   Drug use: Not Currently   Sexual activity: Not on file  Other Topics Concern   Not on file  Social History Narrative   Not on file   Social Determinants of Health   Financial Resource Strain: Not on file  Food Insecurity: Not on file  Transportation Needs: Not on file  Physical Activity: Not on file  Stress: Not on file  Social Connections: Not  on file  Intimate Partner Violence: Not on file     ROS- All systems are reviewed and negative except as per the HPI above.  Physical Exam: Vitals:   08/03/21 0949  BP: (!) 150/80  Pulse: 74  Weight: 88.6 kg  Height: 6' (1.829 m)    GEN- The patient is a well appearing male, alert and oriented x 3 today.   HEENT-head normocephalic, atraumatic, sclera clear, conjunctiva pink, hearing intact, trachea midline. Lungs- Clear to ausculation bilaterally, normal work of breathing Heart- Regular rate and rhythm, no murmurs, rubs or gallops  GI- soft, NT, ND, + BS Extremities- no clubbing, cyanosis, or edema MS- no significant deformity or atrophy Skin- no rash or lesion Psych- euthymic mood, full affect Neuro- strength and sensation are intact   Wt Readings from Last 3 Encounters:  08/03/21 88.6 kg  07/27/21 87.5 kg  07/10/21 89.8 kg    EKG today demonstrates  SR, 1st degree AV block Vent. rate 74 BPM PR interval 204 ms QRS duration 90 ms QT/QTcB 380/421 ms  Echo 07/01/21 demonstrated  1. Left ventricular ejection fraction, by estimation, is 60 to 65%. The  left ventricle has normal function. The left ventricle has no regional  wall motion abnormalities. Left ventricular diastolic parameters are  indeterminate.   2. Right ventricular systolic function is normal. The right ventricular  size is normal. Tricuspid regurgitation signal is inadequate for assessing PA pressure.   3. Left atrial size was mildly dilated.   4. The mitral valve is normal in structure. No evidence of mitral valve  regurgitation.   5. The aortic valve is normal in structure. Aortic valve regurgitation is  mild. mild focal calcification.   6. The inferior vena cava is dilated in size with >50% respiratory  variability, suggesting right atrial pressure of 8 mmHg.   Conclusion(s)/Recommendation(s): Otherwise normal echocardiogram, with minor abnormalities described in the report.   Epic records are  reviewed at length today  CHA2DS2-VASc Score = 3  The patient's score is based upon: CHF History: 0 HTN History: 1 Diabetes History: 0 Stroke History: 0 Vascular Disease History: 1 Age Score: 1 Gender Score: 0        ASSESSMENT AND PLAN: 1. Persistent Atrial Fibrillation (ICD10:  I48.19) The patient's CHA2DS2-VASc score is 3, indicating a 3.2% annual risk of stroke.   S/p DCCV on 07/27/21 Patient back in SR. Continue Eliquis 5 mg BID Continue diltiazem 240 mg daily with 30 mg PRN q 4 hours for heart racing.  2. Secondary Hypercoagulable State (ICD10:  D68.69) The patient is at significant risk for stroke/thromboembolism based upon his CHA2DS2-VASc Score of 3.  Continue Apixaban (Eliquis).   3. HTN Stable, no changes today.  4. Aortic atherosclerosis/CAD Noted on CT On rosuvastatin  No anginal symptoms.   Follow up with Dr Ellyn Hack as scheduled.    [facility] [address] [place], [place] [address] [phone] 08/03/2021 10:13 AM

## 2021-08-22 ENCOUNTER — Ambulatory Visit: Payer: Medicare PPO | Admitting: Cardiology

## 2021-08-22 ENCOUNTER — Encounter: Payer: Self-pay | Admitting: Cardiology

## 2021-08-22 ENCOUNTER — Other Ambulatory Visit: Payer: Self-pay

## 2021-08-22 VITALS — BP 148/76 | HR 79 | Ht 72.0 in | Wt 193.4 lb

## 2021-08-22 DIAGNOSIS — I700 Atherosclerosis of aorta: Secondary | ICD-10-CM

## 2021-08-22 DIAGNOSIS — I4819 Other persistent atrial fibrillation: Secondary | ICD-10-CM | POA: Diagnosis not present

## 2021-08-22 DIAGNOSIS — I2510 Atherosclerotic heart disease of native coronary artery without angina pectoris: Secondary | ICD-10-CM

## 2021-08-22 DIAGNOSIS — E785 Hyperlipidemia, unspecified: Secondary | ICD-10-CM

## 2021-08-22 DIAGNOSIS — I10 Essential (primary) hypertension: Secondary | ICD-10-CM

## 2021-08-22 DIAGNOSIS — R079 Chest pain, unspecified: Secondary | ICD-10-CM | POA: Insufficient documentation

## 2021-08-22 DIAGNOSIS — D6869 Other thrombophilia: Secondary | ICD-10-CM

## 2021-08-22 LAB — BASIC METABOLIC PANEL
BUN/Creatinine Ratio: 17 (ref 10–24)
BUN: 16 mg/dL (ref 8–27)
CO2: 26 mmol/L (ref 20–29)
Calcium: 9.3 mg/dL (ref 8.6–10.2)
Chloride: 102 mmol/L (ref 96–106)
Creatinine, Ser: 0.95 mg/dL (ref 0.76–1.27)
Glucose: 109 mg/dL — ABNORMAL HIGH (ref 70–99)
Potassium: 4.5 mmol/L (ref 3.5–5.2)
Sodium: 139 mmol/L (ref 134–144)
eGFR: 87 mL/min/{1.73_m2} (ref >59)

## 2021-08-22 MED ORDER — METOPROLOL TARTRATE 50 MG PO TABS: 100.0000 mg | ORAL_TABLET | Freq: Once | ORAL | 0 refills | Status: DC

## 2021-08-22 NOTE — Assessment & Plan Note (Signed)
Thoracic aortic atherosclerosis seen on CT scan.  No sign of aneurysmal dilation.  Continue blood pressure control and lipid management as noted.  Will need to evaluate for AAA in the next couple years.

## 2021-08-22 NOTE — Patient Instructions (Addendum)
Medication Instructions:   Metoprolol tartrate - one time use - see instruction below   *If you need a refill on your cardiac medications before your next appointment, please call your pharmacy*   Lab Work: BMP  - see instruction below  If you have labs (blood work) drawn today and your tests are completely normal, you will receive your results only by: [facility] (if you have MyChart) OR A paper copy in the mail If you have any lab test that is abnormal or we need to change your treatment, we will call you to review the results.   Testing/Procedures: Will be schedule at [facility] radiology [address]  once insurance authorization is obtained  Your physician has requested that you have cardiac CTA. Cardiac computed tomography (CT) is a painless test that uses an x-ray machine to take clear, detailed pictures of your heart.Please follow instruction sheet as given.      Follow-Up: At [facility], you and your health needs are our priority.  As part of our continuing mission to provide you with exceptional heart care, we have created designated Provider Care Teams.  These Care Teams include your primary Cardiologist (physician) and Advanced Practice Providers (APPs -  Physician Assistants and Nurse Practitioners) who all work together to provide you with the care you need, when you need it.     Your next appointment:   2 month(s)  The format for your next appointment:   In Person  Provider:   Glenetta Hew, MD    Other Instructions       Your cardiac CT will be scheduled at the below location:   [facility] [address] [place], [place] [address] [phone]    Please arrive at the [facility] main entrance (entrance A) of [facility] 30 minutes prior to test start time. You can use the FREE valet parking offered at the main entrance (encouraged to control the heart rate for the test) Proceed to the [facility] Radiology  Department (first floor) to check-in and test prep.   Please follow these instructions carefully (unless otherwise directed):  Hold all erectile dysfunction medications at least 3 days (72 hrs) prior to test.  Please have have lab BMP - at least 1 week prior to test.    On the Night Before the Test: Be sure to Drink plenty of water. Do not consume any caffeinated/decaffeinated beverages or chocolate 12 hours prior to your test. Do not take any antihistamines 12 hours prior to your test.   On the Day of the Test: Drink plenty of water until 1 hour prior to the test. Do not eat any food 4 hours prior to the test. You may take your regular medications prior to the test.  Take metoprolol (Lopressor)  100 mg two hours prior to test.    After the Test: Drink plenty of water. After receiving IV contrast, you may experience a mild flushed feeling. This is normal. On occasion, you may experience a mild rash up to 24 hours after the test. This is not dangerous. If this occurs, you can take Benadryl 25 mg and increase your fluid intake. If you experience trouble breathing, this can be serious. If it is severe call 911 IMMEDIATELY. If it is mild, please call our office.   We will call to schedule your test 2-4 weeks out understanding that some insurance companies will need an authorization prior to the service being performed.   For non-scheduling  related questions, please contact the cardiac imaging nurse navigator should you have any questions/concerns: Marchia Bond, Cardiac Imaging Nurse Navigator Gordy Clement, Cardiac Imaging Nurse Navigator [facility] Direct Office Dial: [phone]   For scheduling needs, including cancellations and rescheduling, please call [place], [phone].

## 2021-08-22 NOTE — Assessment & Plan Note (Signed)
Borderline blood pressures today.  He says at home they are in anywhere from 115/70-1 40.  Of asked that he monitor his pressures at home.  Make consider restarting a lower dose of lisinopril.  Probably would not restart the baseline 40 mg.  For now we will continue on current dose of diltiazem and reassess in follow-up.

## 2021-08-22 NOTE — Assessment & Plan Note (Signed)
A. fib-CHA2DS2-VASc score 3.  (Age, HTN, vascular disease)  On Eliquis.  Tolerating well with no bleeding issues.

## 2021-08-22 NOTE — Progress Notes (Signed)
Primary Care Provider: Jonathon Bellows, [facility] Cardiologist: Glenetta Hew, MD New Electrophysiologist: None  Clinic Note: Chief Complaint  Patient presents with   Atrial Fibrillation    Establishing primary cardiologist care with new diagnosis of A. fib-s/p DCCV.   ===================================  ASSESSMENT/PLAN   Problem List Items Addressed This Visit       Cardiology Problems   Persistent atrial fibrillation [facility]): CHA2DS2-VASc 3 -> Eliquis - Primary (Chronic)    Successfully managing sinus rhythm from a symptom standpoint.  Rate controlled with diltiazem 240 mg daily.  He has breakthrough doses of short acting diltiazem 30 milligrams to take PRN every 8 hours.  CHA2DS2-VASc score 3 --> on Eliquis.  No suggestions of OSA or CHF.  New diagnosis of A. fib and a 71 year old gentleman with hypertension and hyperlipidemia noted to have coronary calcification and aortic atherosclerosis on CT scan.  Would need to exclude ischemic etiology.  Plan: Coronary CTA with FFRCT Continue current dose of diltiazem and Eliquis.      Relevant Medications   metoprolol tartrate (LOPRESSOR) 50 MG tablet   Other Relevant Orders   EKG [id number]   Basic metabolic panel (Completed)   CT CORONARY MORPH W/CTA COR W/SCORE W/CA W/CM &/OR WO/CM   Essential hypertension (Chronic)    Borderline blood pressures today.  He says at home they are in anywhere from 115/70-1 40.  Of asked that he monitor his pressures at home.  Make consider restarting a lower dose of lisinopril.  Probably would not restart the baseline 40 mg.  For now we will continue on current dose of diltiazem and reassess in follow-up.      Relevant Medications   metoprolol tartrate (LOPRESSOR) 50 MG tablet   Hyperlipidemia LDL goal <100 (Chronic)    Evidence of coronary calcification and aortic atherosclerosis-aggressive lipid management.  On statin now.  We will need to recheck lipid panel after  follow-up.      Relevant Medications   metoprolol tartrate (LOPRESSOR) 50 MG tablet   Coronary artery calcification seen on CAT scan    Coronary Occasion seen on CT scan.  New diagnosis of A. fib, hyperlipidemia and hypertension.  He is also having some intermittent chest discomfort.   Need to exclude ischemic CAD as the etiology for A. Fib.  Risk Stratification and Ischemic Evaluation with Coronary CT Angiogram and possible FFRCT.      Relevant Medications   metoprolol tartrate (LOPRESSOR) 50 MG tablet   Aortic atherosclerosis (HCC)    Thoracic aortic atherosclerosis seen on CT scan.  No sign of aneurysmal dilation.  Continue blood pressure control and lipid management as noted.  Will need to evaluate for AAA in the next couple years.      Relevant Medications   metoprolol tartrate (LOPRESSOR) 50 MG tablet     Other   Secondary hypercoagulable state (HCC)    A. fib-CHA2DS2-VASc score 3.  (Age, HTN, vascular disease)  On Eliquis.  Tolerating well with no bleeding issues.      Chest pain of uncertain etiology    Most of the discomfort he was having in his chest was associated with A. fib, but he still has occasional discomfort in his chest.  Difficult to really assess what it is.  But based on the new diagnosis of A. fib and his risk factors, we do need to exclude ischemia as noted.  In order to get a baseline cardiovascular risk would like Coronary Calcium Score component, and need to  just understand baseline plaque buildup.  This is not provided by my review, therefore we will proceed with Coronary CTA which allows for both anatomic and potentially physiologic (with FFRCT) evaluation.      Relevant Orders   CT CORONARY MORPH W/CTA COR W/SCORE W/CA W/CM &/OR WO/CM    ===================================  HPI:    Albert [name] is a 71 y.o. male with history of HTN, HLD, PAF and CAD on CT who is being seen today for the establishment of primary cardiologist at the  request of Fenton, Clint R, PA.    A. fib history: ->  Beginning summer 2022 started noticing more frequent irregular heartbeat sensations and indications on his smart watch that he was having irregular heartbeats.  However not diagnosed with any arrhythmia. At a routine [employer] workout, cardiac monitor indicated irregular rhythm.  He had just returned from the beach, and felt that he may have been a little dehydrated. 06/30/2021: Seen by PCP for increasing palpitations and exercise intolerance-EKG indicated A. fib RVR. Denied chest pain, diaphoresis, dizziness syncope/near-syncope.  Felt a little dehydrated with orthostatic dizziness.  No previous edema.=> referred to [facility] ER.    Recent Hospitalizations:  [facility] following ER visit for palpitations: Seen by Dr. Gwyndolyn Kaufman in consultation. EKGs of A. fib RVR (146 bpm, NS ST with T wave inversion in aVL).  Normal CXR. PE protocol CTA-no PE, no acute process.  Coronary and aortic atherosclerosis noted. Started on IV diltiazem for rate control while heparin drip; cardiology consult Converted to p.o. diltiazem after rate controlled; converted to apixaban Plan was outpatient follow-up at [facility] with possible DCCV after 3 weeks versus sooner if symptoms worsen with TEE).  --> 07/10/2021-A. fib clinic follow-up: No chest pain dyspnea orthopnea PND edema dizziness etc.  Intermittent palpitations, but not overly symptomatic.  Rate seemed to control. => Planned for outpatient DCCV after 3 weeks of uninterrupted anticoagulation CHA2DS2-VASc score 3-HTN, vascular disease, age (1) -> Eliquis 5 mg twice daily Continue diltiazem 240 mg daily with PRN 30 mg short acting diltiazem for tachycardia 07/27/2021: Successful DCCV  Albert [name] was last seen at the A. fib clinic by Mr. Fenton, PA on August 03, 2021 for post DCCV follow-up.  Completely asymptomatic.  Seem to be maintaining sinus rhythm.  No further palpitations.  No  bleeding issues. ->  Patient requested to be seen by me (Dr. Ellyn Hack), recommended by close friend.   Reviewed  CV studies:    The following studies were reviewed today: (if available, images/films reviewed: From Epic Chart or Care Everywhere) DCCV 07/27/2021 2D Echo July 01, 2021: EF 60 to 65%.  Mild LAE. Mildly elevated RAP.  PE protocol CT June 30, 2021: No PE.  Aortic atherosclerosis, coronary atherosclerosis noted.  Interval History:   Albert [name] presents here today to establish outpatient primary cardiology care.  He is pretty active at baseline.  He routinely works out at [employer].  Exercises for least an hour or so just about every day.  He says that he has been doing quite well.  No major symptoms to speak of.  He has a couple palpitations here and there but not the fluttering sensation that he had when he was diagnosed with A. fib.  He definitely did feel the A. fib and noted a difference after cardioversion.  All he has had now has a few skipped beats but nothing lasting more than a few seconds.  He occasionally has an unusual pressure or  tight sensation in his chest that sometimes happens if he overdoes it with exertion, but can also happen at rest. He did have that sensation when he had his A. fib, has been much less noticeable since being out of A. fib.  His blood pressure today is a little higher than it usually is.  He does wonder about it because prior to being switched to diltiazem, he was on lisinopril 40 mg daily and HCTZ 25 mg daily.  He is now on diltiazem.  He is concerned because sometimes at home his blood pressures are a little bit elevated.  Usually they are lower after he exercises.  Presents yesterday after exercising his blood pressure is 117/76, but that at home his pressures are in the 130s over 70s.  He denies any headaches or blurred vision.  CV Review of Symptoms (Summary) Cardiovascular ROS: positive for - chest pain and -rare short-lived  palpitations but no "fluttering "that he had with A. fib. negative for - dyspnea on exertion, edema, orthopnea, paroxysmal nocturnal dyspnea, rapid heart rate, shortness of breath, or lightheadedness or dizziness wooziness, syncope/near syncope or TIA/amaurosis fugax, claudication  REVIEWED OF SYSTEMS   Review of Systems  Constitutional:  Negative for malaise/fatigue and weight loss.  HENT:  Negative for congestion and nosebleeds.   Respiratory:  Negative for cough and shortness of breath.   Gastrointestinal:  Negative for blood in stool and melena.  Genitourinary:  Negative for hematuria.  Musculoskeletal:  Negative for falls, joint pain and myalgias.  Neurological:  Negative for dizziness, focal weakness and weakness.  Psychiatric/Behavioral: Negative.    All other systems reviewed and are negative.  I have reviewed and (if needed) personally updated the patient's problem list, medications, allergies, past medical and surgical history, social and family history.   PAST MEDICAL HISTORY   Past Medical History:  Diagnosis Date   Allergic rhinitis    Aortic atherosclerosis ([place]) 06/2021   Noted on CT scan   BPH (benign prostatic hyperplasia)    Coronary artery calcification seen on CAT scan 06/2021   GERD (gastroesophageal reflux disease)    Hyperlipidemia    Hypertension    Persistent atrial fibrillation ([place]) 06/30/2021   CHA2DS2-VASc score 3-on Eliquis and diltiazem    PAST SURGICAL HISTORY   Past Surgical History:  Procedure Laterality Date   CARDIOVERSION N/A 07/27/2021   Procedure: CARDIOVERSION;  Surgeon: Pixie Casino, MD;  Location: [facility];  Service: Cardiovascular;  Laterality: N/A;   TRANSTHORACIC ECHOCARDIOGRAM  06/2021   (In setting of A. fib)EF 60 to 65%.  Mild LAE. Mildly elevated RAP.    Immunization History  Administered Date(s) Administered   H1N1 07/12/2008, 07/12/2008   Influenza, High Dose Seasonal PF 05/19/2019   Influenza, Seasonal,  Injecte, Preservative Fre 04/09/2011, 05/07/2012, 03/25/2013   Influenza,inj,Quad PF,6+ Mos 05/12/2018   Influenza,inj,quad, With Preservative 05/05/2014   Influenza-Unspecified 04/27/2008, 07/12/2008, 03/29/2009, 04/09/2011, 05/07/2012, 03/25/2013, 05/05/2014, 05/25/2015, 05/25/2016, 05/29/2017, 05/10/2020, 05/03/2021   PFIZER Comirnaty(Gray Top)Covid-19 Tri-Sucrose Vaccine 02/26/2021   PFIZER(Purple Top)SARS-COV-2 Vaccination 08/24/2019, 09/14/2019, 04/23/2020   Pfizer Covid-19 Vaccine Bivalent Booster 26yrs & up 06/15/2021   Pneumococcal Conjugate-13 12/30/2015   Pneumococcal Polysaccharide-23 01/21/2019   Tdap 02/10/2008, 10/11/2016   Zoster, Live 09/29/2012    MEDICATIONS/ALLERGIES   Current Meds  Medication Sig   apixaban (ELIQUIS) 5 MG TABS tablet Take 1 tablet (5 mg total) by mouth 2 (two) times daily.   Calcium Carbonate-Simethicone (ROLAIDS PLUS GAS RELIEF EX ST PO) Take 2 tablets by mouth daily  as needed (heartburn/gas).   Cholecalciferol 50 MCG (2000 UT) TABS Take 2,000 Units by mouth daily.   diltiazem (CARDIZEM CD) 240 MG 24 hr capsule Take 1 capsule (240 mg total) by mouth daily.   diltiazem (CARDIZEM) 30 MG tablet Take 1 tablet (30 mg total) by mouth every 8 (eight) hours as needed (HR>110).   metoprolol tartrate (LOPRESSOR) 50 MG tablet Take 2 tablets (100 mg total) by mouth once for 1 dose. TAKE TWO HOURS PRIOR TO  SCHEDULE CARDIAC TEST   rosuvastatin (CRESTOR) 10 MG tablet Take 10 mg by mouth at bedtime.   vitamin B-12 (CYANOCOBALAMIN) 100 MCG tablet Take 100 mcg by mouth daily.    No Known Allergies  SOCIAL HISTORY/FAMILY HISTORY   Reviewed in Epic:   Social History   Tobacco Use   Smoking status: Never   Smokeless tobacco: Never  Substance Use Topics   Alcohol use: Not Currently   Drug use: Not Currently   Social History   Social History Narrative   Not on file   History reviewed. No pertinent family history. Colon cancer Father   OBJCTIVE -PE,  EKG, labs   Wt Readings from Last 3 Encounters:  08/22/21 193 lb 6.4 oz (87.7 kg)  08/03/21 195 lb 6.4 oz (88.6 kg)  07/27/21 193 lb (87.5 kg)    Physical Exam: BP (!) 148/76    Pulse 79    Ht 6' (1.829 m)    Wt 193 lb 6.4 oz (87.7 kg)    SpO2 98%    BMI 26.23 kg/m  Physical Exam Vitals reviewed.  Constitutional:      General: He is not in acute distress.    Appearance: Normal appearance. He is normal weight. He is not toxic-appearing.     Comments: Well-nourished, well-groomed.  Healthy-appearing.  HENT:     Head: Normocephalic and atraumatic.  Neck:     Vascular: No carotid bruit.  Cardiovascular:     Rate and Rhythm: Normal rate and regular rhythm.     Pulses: Normal pulses.     Heart sounds: Normal heart sounds. No murmur heard.   No friction rub. No gallop.  Pulmonary:     Effort: Pulmonary effort is normal. No respiratory distress.     Breath sounds: Normal breath sounds. No wheezing, rhonchi or rales.  Chest:     Chest wall: No tenderness.  Abdominal:     General: Abdomen is flat. Bowel sounds are normal. There is no distension.     Palpations: Abdomen is soft.     Tenderness: There is no abdominal tenderness.     Comments: No HSM or bruit  Musculoskeletal:        General: No swelling. Normal range of motion.     Cervical back: Normal range of motion and neck supple.  Skin:    General: Skin is warm and dry.  Neurological:     General: No focal deficit present.     Mental Status: He is alert and oriented to person, place, and time.     Gait: Gait normal.  Psychiatric:        Mood and Affect: Mood normal.        Behavior: Behavior normal.        Thought Content: Thought content normal.        Judgment: Judgment normal.     Adult ECG Report  Rate: 79 ;  Rhythm: normal sinus rhythm and normal axis, intervals durations. ;   Narrative Interpretation: Stable  Recent Labs: Chemistry ordered today.  Otherwise reviewed.  No lipids available. No results found for:  CHOL, HDL, LDLCALC, LDLDIRECT, TRIG, CHOLHDL Lab Results  Component Value Date   CREATININE 0.95 08/22/2021   BUN 16 08/22/2021   NA 139 08/22/2021   K 4.5 08/22/2021   CL 102 08/22/2021   CO2 26 08/22/2021   CBC Latest Ref Rng & Units 07/26/2021 07/10/2021 07/02/2021  WBC 4.0 - 10.5 K/uL 6.6 6.0 5.5  Hemoglobin 13.0 - 17.0 g/dL 16.7 16.8 15.0  Hematocrit 39.0 - 52.0 % 50.9 51.5 45.1  Platelets 150 - 400 K/uL 197 215 177    No results found for: HGBA1C Lab Results  Component Value Date   TSH 2.025 06/30/2021    ==================================================  COVID-19 Education: The signs and symptoms of COVID-19 were discussed with the patient and how to seek care for testing (follow up with PCP or arrange E-visit).    I spent a total of 26 minutes with the patient spent in direct patient consultation.  Additional time spent with chart review  / charting (studies, outside notes, etc): 30 min -> Chart review including hospitalization, 2 clinic notes, procedure notes and images are reviewed.  Medical history updated. Total Time: 56 min  Current medicines are reviewed at length with the patient today.  (+/- concerns) N/A  This visit occurred during the SARS-CoV-2 public health emergency.  Safety protocols were in place, including screening questions prior to the visit, additional usage of staff PPE, and extensive cleaning of exam room while observing appropriate contact time as indicated for disinfecting solutions.  Notice: This dictation was prepared with Dragon dictation along with smart phrase technology. Any transcriptional errors that result from this process are unintentional and may not be corrected upon review.   Studies Ordered:  Orders Placed This Encounter  Procedures   CT CORONARY MORPH W/CTA COR W/SCORE W/CA W/CM &/OR WO/CM   Basic metabolic panel   EKG XX123456    Patient Instructions / Medication Changes & Studies & Tests Ordered   Patient Instructions   Medication Instructions:   Metoprolol tartrate - one time use - see instruction below   *If you need a refill on your cardiac medications before your next appointment, please call your pharmacy*   Lab Work: BMP  - see instruction below  If you have labs (blood work) drawn today and your tests are completely normal, you will receive your results only by: MyChart Message (if you have MyChart) OR A paper copy in the mail If you have any lab test that is abnormal or we need to change your treatment, we will call you to review the results.   Testing/Procedures: Will be schedule at [facility] radiology [address]  once insurance authorization is obtained  Your physician has requested that you have cardiac CTA. Cardiac computed tomography (CT) is a painless test that uses an x-ray machine to take clear, detailed pictures of your heart.Please follow instruction sheet as given.      Follow-Up: At [facility], you and your health needs are our priority.  As part of our continuing mission to provide you with exceptional heart care, we have created designated Provider Care Teams.  These Care Teams include your primary Cardiologist (physician) and Advanced Practice Providers (APPs -  Physician Assistants and Nurse Practitioners) who all work together to provide you with the care you need, when you need it.     Your next appointment:   2 month(s)  The format  for your next appointment:   In Person  Provider:   Glenetta Hew, MD    Other Instructions       Your cardiac CT will be scheduled at the below location:   [facility] [address] [place], [place] [address] [phone]    Please arrive at the [facility] main entrance (entrance A) of [facility] 30 minutes prior to test start time. You can use the FREE valet parking offered at the main entrance (encouraged to control the heart rate for the test) Proceed to the [facility] Radiology  Department (first floor) to check-in and test prep.   Please follow these instructions carefully (unless otherwise directed):  Hold all erectile dysfunction medications at least 3 days (72 hrs) prior to test.  Please have have lab BMP - at least 1 week prior to test.    On the Night Before the Test: Be sure to Drink plenty of water. Do not consume any caffeinated/decaffeinated beverages or chocolate 12 hours prior to your test. Do not take any antihistamines 12 hours prior to your test.   On the Day of the Test: Drink plenty of water until 1 hour prior to the test. Do not eat any food 4 hours prior to the test. You may take your regular medications prior to the test.  Take metoprolol (Lopressor)  100 mg two hours prior to test.    After the Test: Drink plenty of water. After receiving IV contrast, you may experience a mild flushed feeling. This is normal. On occasion, you may experience a mild rash up to 24 hours after the test. This is not dangerous. If this occurs, you can take Benadryl 25 mg and increase your fluid intake. If you experience trouble breathing, this can be serious. If it is severe call 911 IMMEDIATELY. If it is mild, please call our office.   We will call to schedule your test 2-4 weeks out understanding that some insurance companies will need an authorization prior to the service being performed.   For non-scheduling related questions, please contact the cardiac imaging nurse navigator should you have any questions/concerns: Marchia Bond, Cardiac Imaging Nurse Navigator Gordy Clement, Cardiac Imaging Nurse Navigator [facility] Direct Office Dial: [phone]   For scheduling needs, including cancellations and rescheduling, please call [place], [phone].     Glenetta Hew, M.D., M.S. Interventional Cardiologist   Pager # [phone] Phone # [phone] [address] [place], [place] [address]   Thank  you for choosing Heart[facility] at [facility]!!

## 2021-08-22 NOTE — Assessment & Plan Note (Signed)
Evidence of coronary calcification and aortic atherosclerosis-aggressive lipid management.  On statin now.  We will need to recheck lipid panel after follow-up.

## 2021-08-22 NOTE — Assessment & Plan Note (Addendum)
Successfully managing sinus rhythm from a symptom standpoint.  Rate controlled with diltiazem 240 mg daily.  He has breakthrough doses of short acting diltiazem 30 milligrams to take PRN every 8 hours.  CHA2DS2-VASc score 3 --> on Eliquis.   No suggestions of OSA or CHF.  New diagnosis of A. fib and a 71 year old gentleman with hypertension and hyperlipidemia noted to have coronary calcification and aortic atherosclerosis on CT scan.  Would need to exclude ischemic etiology.  Plan: Coronary CTA with FFRCT  Continue current dose of diltiazem and Eliquis.

## 2021-08-22 NOTE — Assessment & Plan Note (Signed)
Most of the discomfort he was having in his chest was associated with A. fib, but he still has occasional discomfort in his chest.  Difficult to really assess what it is.  But based on the new diagnosis of A. fib and his risk factors, we do need to exclude ischemia as noted.  In order to get a baseline cardiovascular risk would like Coronary Calcium Score component, and need to just understand baseline plaque buildup.  This is not provided by my review, therefore we will proceed with Coronary CTA which allows for both anatomic and potentially physiologic (with FFRCT) evaluation.

## 2021-08-22 NOTE — Assessment & Plan Note (Signed)
Coronary Occasion seen on CT scan.  New diagnosis of A. fib, hyperlipidemia and hypertension.  He is also having some intermittent chest discomfort.   Need to exclude ischemic CAD as the etiology for A. Fib.   Risk Stratification and Ischemic Evaluation with Coronary CT Angiogram and possible FFRCT.

## 2021-08-29 ENCOUNTER — Telehealth (HOSPITAL_COMMUNITY): Payer: Self-pay | Admitting: *Deleted

## 2021-08-29 NOTE — Telephone Encounter (Signed)
Reaching out to patient to offer assistance regarding upcoming cardiac imaging study; pt verbalizes understanding of appt date/time, parking situation and where to check in, pre-test NPO status and medications ordered, and verified current allergies; name and call back number provided for further questions should they arise  Larey Brick RN Navigator Cardiac Imaging [facility] [phone] office [phone] cell  Patient to take his daily cardizem and 100mg  metoprolol tartrate two hours prior to his cardiac CT scan. He is aware to arrive at 8:30am for his 9am scan.

## 2021-08-31 ENCOUNTER — Encounter (HOSPITAL_COMMUNITY): Payer: Self-pay

## 2021-08-31 ENCOUNTER — Ambulatory Visit (HOSPITAL_COMMUNITY)
Admission: RE | Admit: 2021-08-31 | Discharge: 2021-08-31 | Disposition: A | Discharge status: Home / Self Care | Payer: Medicare PPO | Source: Ambulatory Visit | Attending: Cardiology | Admitting: Cardiology

## 2021-08-31 ENCOUNTER — Other Ambulatory Visit: Payer: Self-pay

## 2021-08-31 DIAGNOSIS — R079 Chest pain, unspecified: Secondary | ICD-10-CM | POA: Diagnosis present

## 2021-08-31 DIAGNOSIS — I4819 Other persistent atrial fibrillation: Secondary | ICD-10-CM | POA: Insufficient documentation

## 2021-08-31 DIAGNOSIS — I700 Atherosclerosis of aorta: Secondary | ICD-10-CM

## 2021-08-31 HISTORY — PX: CT CTA CORONARY W/CA SCORE W/CM &/OR WO/CM: HXRAD787

## 2021-08-31 MED ORDER — NITROGLYCERIN 0.4 MG SL SUBL
SUBLINGUAL_TABLET | SUBLINGUAL | Status: AC
  Administered 2021-08-31: 0.8 mg via SUBLINGUAL
  Filled 2021-08-31: qty 2

## 2021-08-31 MED ORDER — NITROGLYCERIN 0.4 MG SL SUBL: 0.8000 mg | SUBLINGUAL_TABLET | Freq: Once | SUBLINGUAL | Status: AC

## 2021-08-31 MED ORDER — IOHEXOL 350 MG/ML SOLN
95.0000 mL | Freq: Once | INTRAVENOUS | Status: AC | PRN
  Administered 2021-08-31: 95 mL via INTRAVENOUS

## 2021-11-14 ENCOUNTER — Encounter: Payer: Self-pay | Admitting: Cardiology

## 2021-11-14 ENCOUNTER — Ambulatory Visit: Payer: Medicare PPO | Admitting: Cardiology

## 2021-11-14 VITALS — BP 148/78 | HR 69 | Ht 72.0 in | Wt 191.2 lb

## 2021-11-14 DIAGNOSIS — I2510 Atherosclerotic heart disease of native coronary artery without angina pectoris: Secondary | ICD-10-CM

## 2021-11-14 DIAGNOSIS — I10 Essential (primary) hypertension: Secondary | ICD-10-CM | POA: Diagnosis not present

## 2021-11-14 DIAGNOSIS — D6869 Other thrombophilia: Secondary | ICD-10-CM

## 2021-11-14 DIAGNOSIS — I700 Atherosclerosis of aorta: Secondary | ICD-10-CM

## 2021-11-14 DIAGNOSIS — I4819 Other persistent atrial fibrillation: Secondary | ICD-10-CM | POA: Diagnosis not present

## 2021-11-14 DIAGNOSIS — E785 Hyperlipidemia, unspecified: Secondary | ICD-10-CM

## 2021-11-14 MED ORDER — LISINOPRIL 20 MG PO TABS: 20.0000 mg | ORAL_TABLET | Freq: Every day | ORAL | 3 refills | Status: DC

## 2021-11-14 NOTE — Progress Notes (Signed)
? ?Primary Care Provider: Jonathon Bellows, PA-C ?Cardiologist: Albert Hew, MD ?Electrophysiologist: None ? ?Clinic Note: ?Chief Complaint  ?Patient presents with  ? Follow-up  ?  Test results  ? Atrial Fibrillation  ?  No recurrent episodes  ? ? ?=================================== ? ?ASSESSMENT/PLAN  ? ?Problem List Items Addressed This Visit   ? ?  ? Cardiology Problems  ? Persistent atrial fibrillation ([place]): CHA2DS2-VASc 3 -> Eliquis - Primary (Chronic)  ?  Maintaining sinus rhythm now with no further breakthrough spells. ? ?No bleeding issues. ?Nothing to suggest OSA or CHF symptoms.  Nonischemic Coronary CTA. ? ?Plan: ?Continue current dose of standing diltiazem 240 mg daily with as needed 30 mg for breakthrough. ?Continue Eliquis for stroke prevention ?Discussed ER evaluations for A-fib to allow for cardioversion on DOAC. ?  ?  ? Relevant Medications  ? lisinopril (ZESTRIL) 20 MG tablet  ? Other Relevant Orders  ? EKG 12-Lead (Completed)  ? Essential hypertension (Chronic)  ?  Blood pressure is up and I think that may be because diltiazem is not as potent as lisinopril. ?Plan: Restart lisinopril 20 mg nightly ? ?  ?  ? Relevant Medications  ? lisinopril (ZESTRIL) 20 MG tablet  ? Hyperlipidemia LDL goal <100 (Chronic)  ?  Coronary Calcium Score is 176.  Therefore would like to see LDL less than 100 if not less than 70.  Currently on statin. ?Should be due to have lipids checked by PCP. ? ?  ?  ? Relevant Medications  ? lisinopril (ZESTRIL) 20 MG tablet  ? Coronary artery calcification seen on CAT scan (Chronic)  ?  Minimal disease noted on Coronary CTA which would suggest positive remodeling with elevated Coronary Calcium Score 176. ? ?No aspirin because of Eliquis.  On diltiazem and statin. ? ?  ?  ? Relevant Medications  ? lisinopril (ZESTRIL) 20 MG tablet  ?  ? Other  ? Secondary hypercoagulable state ([place])  ?  A-fib with chads Vascor of 3 (age, HTN, vascular disease). ? ?On Eliquis.  No  bleeding issues. ? ?Okay to hold Eliquis 48 hours prior to procedures.  72 hours for major procedures. ? ?  ?  ? ?=================================== ? ?HPI:   ? ?Albert [name] is a 71 y.o. male with a PMH notable for relatively recent diagnosis of PAF, CAD on CT, HTN, HLD who presents today for 35-month follow-up.  He is being seen today at the request of Albert [name]*. ? ?A. fib history: ->  ?Beginning summer 2022 started noticing more frequent irregular heartbeat sensations and indications on his smart watch that he was having irregular heartbeats.  However not diagnosed with any arrhythmia. ?06/30/2021: Seen by PCP for increasing palpitations and exercise intolerance-EKG indicated A. fib RVR. Denied chest pain, diaphoresis, dizziness syncope/near-syncope.  Felt a little dehydrated with orthostatic dizziness.  No previous edema.=> referred to [facility] ER, seen by Albert [name] in consult.  Converted from IV to p.o. diltiazem and started on apixaban.  Plan for outpatient A-fib visit with DCCV after 3 weeks. ?07/10/2021: A-fib clinic follow-up.  Only noted intermittent palpitations but not symptomatic.  Rate controlled. ?07/28/2019: DCCV ? ?Recent Hospitalizations: None ? ?Albert [name] was last seen on 08/22/2021 -> this is to establish outpatient primary cardiology care.  Still working out at [employer] routinely.  No major symptoms.  Few palpitations here and there but nothing like the fluttering sensation that he had with A-fib.  Indicated that he did notice a difference after cardioversion.  Occasional light chest pressure with overexertion but not with routine activity.  Similar sensation noted with being in A-fib.  Blood pressure was high and he was concerned about that.  He had been switched from lisinopril-HCTZ to diltiazem. ?  ? ?Reviewed  CV studies:   ? ?The following studies were reviewed today: (if available, images/films reviewed: From Epic Chart or Care Everywhere) ?Coronary CTA  08/31/2021: Coronary calcium score of 176.  Normal coronary origin with right dominance. Minimal CAD in the distal LM and proximal LAD/Lcx as outlined above.  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 ? ?Interval History:  ? ?[name] presents today happy to hear the results of his coronary CT.  We discussed in detail what the Coronary Calcium Score means-that there is mostly positive remodeling CAD, but does mean that we wish to be aggressive treating his lipids and blood pressure.  He is concerned about his blood pressure recordings and bring them with him a detailed recording of blood pressures mostly ranging from the high 120s into the [id number] systolic pressures with some as high as the 170s.  6 pages of blood pressures.  He has not had any headaches or blurred vision or dizziness just is concerned about the blood pressures. ?No sensation of recurrent A-fib either tachycardia or or palpitations. ? ?CV Review of Symptoms (Summary) ?Cardiovascular ROS: no chest pain or dyspnea on exertion ?positive for - -concerns about blood pressures but otherwise doing well. ?negative for - edema, irregular heartbeat, orthopnea, palpitations, paroxysmal nocturnal dyspnea, rapid heart rate, shortness of breath, or syncope/near syncope or TIA/amaurosis fugax, claudication ? ?REVIEWED OF SYSTEMS  ? ?Review of Systems  ?Constitutional:  Positive for weight loss. Negative for malaise/fatigue.  ?HENT:  Negative for congestion and nosebleeds.   ?Respiratory:  Negative for cough and shortness of breath.   ?Gastrointestinal:  Negative for blood in stool and melena.  ?Genitourinary:  Negative for hematuria.  ?Musculoskeletal:  Negative for joint pain.  ?Neurological:  Negative for dizziness and focal weakness.  ?Psychiatric/Behavioral:  Negative for depression and memory loss. The patient is not nervous/anxious and does not have insomnia.   ?All other systems reviewed and  are negative. ? ?I have reviewed and (if needed) personally updated the patient's problem list, medications, allergies, past medical and surgical history, social and family history.  ? ?PAST MEDICAL HISTORY  ? ?Past Medical History:  ?Diagnosis Date  ? Allergic rhinitis   ? Aortic atherosclerosis ([place]) 06/2021  ? Noted on CT scan  ? BPH (benign prostatic hyperplasia)   ? Coronary artery calcification seen on CAT scan 06/2021  ? GERD (gastroesophageal reflux disease)   ? Hyperlipidemia   ? Hypertension   ? Persistent atrial fibrillation ([place]) 06/30/2021  ? CHA2DS2-VASc score 3-on Eliquis and diltiazem  ? ? ?PAST SURGICAL HISTORY  ? ?Past Surgical History:  ?Procedure Laterality Date  ? CARDIOVERSION N/A 07/27/2021  ? Procedure: CARDIOVERSION;  Surgeon: Pixie Casino, MD;  Location: [facility] ENDOSCOPY;  Service: Cardiovascular;  Laterality: N/A;  ? CT CTA CORONARY W/CA SCORE W/CM &/OR WO/CM  08/31/2021  ? Coronary calcium score of 176.  Normal coronary origin with right dominance. Minimal CAD in the distal LM and proximal LAD/Lcx as outlined above.  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  ? TRANSTHORACIC ECHOCARDIOGRAM  06/2021  ? (In setting of A. fib)EF 60 to 65%.  Mild LAE. Mildly elevated RAP.  ? ? ?  Immunization History  ?Administered Date(s) Administered  ? H1N1 07/12/2008, 07/12/2008  ? Influenza, High Dose Seasonal PF 05/19/2019  ? Influenza, Seasonal, Injecte, Preservative Fre 04/09/2011, 05/07/2012, 03/25/2013  ? Influenza,inj,Quad PF,6+ Mos 05/12/2018  ? Influenza,inj,quad, With Preservative 05/05/2014  ? Influenza-Unspecified 04/27/2008, 07/12/2008, 03/29/2009, 04/09/2011, 05/07/2012, 03/25/2013, 05/05/2014, 05/25/2015, 05/25/2016, 05/29/2017, 05/10/2020, 05/03/2021  ? PFIZER Comirnaty(Gray Top)Covid-19 Tri-Sucrose Vaccine 02/26/2021  ? PFIZER(Purple Top)SARS-COV-2 Vaccination 08/24/2019, 09/14/2019, 04/23/2020  ? Arboriculturist 10yrs & up 06/15/2021  ? Pneumococcal Conjugate-13 12/30/2015  ? Pneumococcal Polysaccharide-23 01/21/2019  ? Tdap 02/10/2008, 10/11/2016  ? Zoster, Live 09/29/2012  ? ? ?MEDICATIONS/ALLERGIES  ? ?Current

## 2021-11-14 NOTE — Patient Instructions (Addendum)
Medication Instructions:  ? ?Restart taking Lisinopril  20 mg  at bedtime   ? ?*If you need a refill on your cardiac medications before your next appointment, please call your pharmacy* ? ? ?Lab Work: ? ? ?Not needed ? ?Testing/Procedures: ?Not needed ? ? ?Follow-Up: ?At [facility], you and your health needs are our priority.  As part of our continuing mission to provide you with exceptional heart care, we have created designated Provider Care Teams.  These Care Teams include your primary Cardiologist (physician) and Advanced Practice Providers (APPs -  Physician Assistants and Nurse Practitioners) who all work together to provide you with the care you need, when you need it. ? ?  ? ?Your next appointment:   ?7 to 8 month(s)  Nov or Dec 2023 ? ?The format for your next appointment:   ?In Person ? ?Provider:   ?Bryan Lemma, MD  ? ? ? ?

## 2021-11-26 ENCOUNTER — Encounter: Payer: Self-pay | Admitting: Cardiology

## 2021-11-26 NOTE — Assessment & Plan Note (Signed)
Minimal disease noted on Coronary CTA which would suggest positive remodeling with elevated Coronary Calcium Score 176. ? ?No aspirin because of Eliquis.  On diltiazem and statin. ?

## 2021-11-26 NOTE — Assessment & Plan Note (Signed)
A-fib with chads Vascor of 3 (age, HTN, vascular disease). ? ?On Eliquis.  No bleeding issues. ? ?Okay to hold Eliquis 48 hours prior to procedures.  72 hours for major procedures. ?

## 2021-11-26 NOTE — Assessment & Plan Note (Signed)
Coronary Calcium Score is 176.  Therefore would like to see LDL less than 100 if not less than 70.  Currently on statin. ?Should be due to have lipids checked by PCP. ?

## 2021-11-26 NOTE — Assessment & Plan Note (Signed)
Blood pressure is up and I think that may be because diltiazem is not as potent as lisinopril. ?Plan: Restart lisinopril 20 mg nightly ?

## 2021-11-26 NOTE — Assessment & Plan Note (Signed)
Maintaining sinus rhythm now with no further breakthrough spells. ? ?No bleeding issues. ?Nothing to suggest OSA or CHF symptoms.  Nonischemic Coronary CTA. ? ?Plan: ?? Continue current dose of standing diltiazem 240 mg daily with as needed 30 mg for breakthrough. ?? Continue Eliquis for stroke prevention ?? Discussed ER evaluations for A-fib to allow for cardioversion on DOAC. ?

## 2022-06-04 ENCOUNTER — Telehealth: Payer: Self-pay | Admitting: Cardiology

## 2022-06-04 NOTE — Telephone Encounter (Signed)
Patient stated that last night he felt his heart racing. The same this morning. BP 122/69, P 65 and 121/73, P 91. He denies sob, dizziness, lightheadedness. He had cardioversion 06/2021. He is taking medications as prescribed. He has not taken diltiazem for break through pulse greater than 110. Heart rate has not gone that high. He wants to know if he should still go to the gym. Please advise on afib.

## 2022-06-04 NOTE — Telephone Encounter (Signed)
Patient c/o Palpitations:  High priority if patient c/o lightheadedness, shortness of breath, or chest pain  How long have you had palpitations/irregular HR/ Afib? Are you having the symptoms now? Woke up last night and felt heart racing, yes  Are you currently experiencing lightheadedness, SOB or CP? No   Do you have a history of afib (atrial fibrillation) or irregular heart rhythm? yes  Have you checked your BP or HR? (document readings if available): BP fine, says possible afib episode  Are you experiencing any other symptoms? No, anxiety   Patient states he woke up last night and felt his heart racing. He states this morning he checked his Evalee Mutton and it showed he may be in afib. He would like to know if there is anything he needs to do.

## 2022-06-05 NOTE — Telephone Encounter (Signed)
Patient stated that this morning he self converted back to regular rhythm. He wanted to know if he can go to the gym. Recommended patient can go to the gym, but to start out slowly. Explained that patients can go in and out of afib. Also recommended that if he has afib with SOB or dizziness to be taken to the ED. He verbalized understanding. He also asked if it its safe to take the COVID and RSV vaccines. Please advise.

## 2022-06-05 NOTE — Telephone Encounter (Signed)
Patient is calling back for update. Please advise  

## 2022-06-06 NOTE — Telephone Encounter (Signed)
If not overly symptomatic - fine to go to gym.  As for vaccines - I can't see why not.  -- but PCP better to ask.  DH

## 2022-06-07 NOTE — Telephone Encounter (Signed)
Spoke with pt, aware of dr harding's recommendation.

## 2022-06-07 NOTE — Telephone Encounter (Signed)
Patient is returning call.  °

## 2022-06-07 NOTE — Telephone Encounter (Signed)
LMTCB

## 2022-07-02 ENCOUNTER — Telehealth: Payer: Self-pay | Admitting: Cardiology

## 2022-07-02 ENCOUNTER — Other Ambulatory Visit: Payer: Self-pay

## 2022-07-02 MED ORDER — APIXABAN 5 MG PO TABS: 5.0000 mg | ORAL_TABLET | Freq: Two times a day (BID) | ORAL | 1 refills | Status: DC

## 2022-07-02 NOTE — Telephone Encounter (Signed)
Prescription refill request for Eliquis received. Indication:afib Last office visit:4/23 Scr:0.9 Age: 71 Weight:86.7  kg  Prescription refilled

## 2022-07-02 NOTE — Telephone Encounter (Signed)
*  STAT* If patient is at the pharmacy, call can be transferred to refill team.   1. Which medications need to be refilled? (please list name of each medication and dose if known) apixaban (ELIQUIS) 5 MG TABS tablet   diltiazem (CARDIZEM CD) 240 MG 24 hr capsule    diltiazem (CARDIZEM) 30 MG tablet    2. Which pharmacy/location (including street and city if local pharmacy) is medication to be sent to?  [id number] - [place], [place] - [address]    3. Do they need a 30 day or 90 day supply? 90

## 2022-08-15 ENCOUNTER — Encounter: Payer: Self-pay | Admitting: Cardiology

## 2022-08-15 ENCOUNTER — Ambulatory Visit: Payer: Medicare PPO | Attending: Cardiology | Admitting: Cardiology

## 2022-08-15 VITALS — BP 140/82 | HR 75 | Ht 72.0 in | Wt 189.8 lb

## 2022-08-15 DIAGNOSIS — I700 Atherosclerosis of aorta: Secondary | ICD-10-CM | POA: Diagnosis not present

## 2022-08-15 DIAGNOSIS — I2510 Atherosclerotic heart disease of native coronary artery without angina pectoris: Secondary | ICD-10-CM

## 2022-08-15 DIAGNOSIS — D6869 Other thrombophilia: Secondary | ICD-10-CM | POA: Diagnosis not present

## 2022-08-15 DIAGNOSIS — I4819 Other persistent atrial fibrillation: Secondary | ICD-10-CM | POA: Diagnosis not present

## 2022-08-15 DIAGNOSIS — E785 Hyperlipidemia, unspecified: Secondary | ICD-10-CM

## 2022-08-15 DIAGNOSIS — I480 Paroxysmal atrial fibrillation: Secondary | ICD-10-CM

## 2022-08-15 MED ORDER — DILTIAZEM HCL 30 MG PO TABS: 30.0000 mg | ORAL_TABLET | Freq: Three times a day (TID) | ORAL | 5 refills | Status: AC | PRN

## 2022-08-15 NOTE — Progress Notes (Signed)
Primary Care Provider: Jonathon Bellows, [place] Cardiologist: Glenetta Hew, MD Electrophysiologist: None  Clinic Note: No chief complaint on file.   ===================================  ASSESSMENT/PLAN   Problem List Items Addressed This Visit       Cardiology Problems   Persistent atrial fibrillation [facility]): CHA2DS2-VASc 3 -> Eliquis (Chronic)    Thankfully, he is only had 1 episode lasting a few hours on his K[employer]-Mobile in the last several months.  Did not last long however did feel like he needed to take extra PRN dose of diltiazem.  Now remains on STEMI does not thousand 240 mg daily but we will refill the breakthrough short acting if necessary.  Remains on Eliquis with no bleeding issues.      Relevant Medications   diltiazem (CARDIZEM) 30 MG tablet   Other Relevant Orders   EKG 12-Lead (Completed)   Hyperlipidemia LDL goal <100 (Chronic)    Should be due for labs to be checked soon.  Was well-controlled last visit.  He is due to get lipids checked by PCP soon.  Depending what those labs look like, would probably consider titrating up rosuvastatin dose to 20 mg nightly.      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  Okay to hold Eliquis 48 hours prior to Mohs procedures with 72 hours for major/high risk procedures.      Relevant Medications   diltiazem (CARDIZEM) 30 MG tablet   Coronary artery calcification seen on CAT scan - Primary (Chronic)   Relevant Medications   diltiazem (CARDIZEM) 30 MG tablet   Other Relevant Orders   CT ANGIO CHEST AORTA W/CM & OR WO/CM   Basic metabolic panel   Aortic atherosclerosis (HCC) (Chronic)    Noted on CT scan.  Respect modification of lipid and glycemic and blood pressure control recommended. On diltiazem and  lisinopril for blood pressure as well as rosuvastatin for lipids      Relevant Medications   diltiazem (CARDIZEM) 30 MG tablet   Other Relevant Orders   EKG 12-Lead (Completed)   CT ANGIO CHEST AORTA W/CM & OR WO/CM    ===================================  HPI:    Albert [name] is a 72 y.o. male with a PMH notable for PAF, CAD on CT as well as HTN HLD below who presents today for 45-month follow-up at the request of [name], Karmen Bongo*.  Jahziah Simonin was last seen on November 14, 2021 for new diagnosis of A-fib (noted tachycardia spells in the summer 2022, PCP recorded EKG with A-fib RVR in December 2022 = s/p DDCV).  We reviewed his coronary CTA showing minimal disease.  He has blood pressure range from the mid to high 371G to 626R systolic but with some as high as 170s.  No other symptoms.  No signs of recurrent A-fib. Continued on STEMI dose of diltiazem to 40 mg daily with PRN 30 mg for breakthrough spells. We started lisinopril 20 mg daily for hypertension concern.  Continued statin along with Eliquis.  Recent Hospitalizations: None  Reviewed  CV studies:    The following studies were reviewed today: (if available, images/films reviewed: From Epic Chart or Care Everywhere) None:  Interval History:   Aster Eckrich returns here today for early annual follow-up stating that he is doing well overall.  He exercises at the [facility]  Monday through Friday takes weekends off.  He has not had any real symptoms of chest pain, just an irregular sensation in his chest when he feels some fluttering.  He does not really notice tachycardia very frequently.  He says he thought he had 1 episode that he recorded on his [place] that look like A-fib.  It did not last long enough for him to use extra dose of diltiazem.  He denied any syncope or near syncope.  No TIA or emesis fugax.  No claudication symptoms.  No resting or exertional chest pain or pressure/angina.  No PND, orthopnea or  edema. BP range was from 115-130/60-70 and stable.    Continue current talked about the Coronary CTA results showing aorta mildly dilated.  CV Review of Symptoms (Summary): no chest pain or dyspnea on exertion positive for - irregular heartbeat, palpitations, rapid heart rate, and he recorded 1 episode that was longer than a few minutes.  On car via mobile negative for - loss of consciousness, orthopnea, paroxysmal nocturnal dyspnea, or syncope or near syncope, TIA/RCVS or claudication.  REVIEWED OF SYSTEMS   Review of Systems  Constitutional:  Positive for weight loss (Ongoing slow weight loss). Negative for malaise/fatigue.  HENT:  Negative for nosebleeds.   Respiratory:  Negative for cough and shortness of breath.   Gastrointestinal:  Negative for blood in stool and melena.  Genitourinary:  Negative for dysuria and hematuria.  Musculoskeletal:  Negative for back pain, falls and joint pain.  Neurological:  Negative for dizziness, focal weakness and headaches.  Psychiatric/Behavioral:  Negative for depression, hallucinations, memory loss and substance abuse. The patient is nervous/anxious. The patient does not have insomnia.     I have reviewed and (if needed) personally updated the patient's problem list, medications, allergies, past medical and surgical history, social and family history.   PAST MEDICAL HISTORY   Past Medical History:  Diagnosis Date   Allergic rhinitis    Aortic atherosclerosis (HCC) 06/2021   Noted on CT scan   BPH (benign prostatic hyperplasia)    Coronary artery calcification seen on CAT scan 06/2021   GERD (gastroesophageal reflux disease)    Hyperlipidemia    Hypertension    Persistent atrial fibrillation (HCC) 06/30/2021   CHA2DS2-VASc score 3-on Eliquis and diltiazem    PAST SURGICAL HISTORY   Past Surgical History:  Procedure Laterality Date   CARDIOVERSION N/A 07/27/2021   Procedure: CARDIOVERSION;  Surgeon: [name], MD;  Location:  [facility];  Service: Cardiovascular;  Laterality: N/A;   CT CTA CORONARY W/CA SCORE W/CM &/OR WO/CM  08/31/2021   Coronary calcium score of 176.  Normal coronary origin with right dominance. Minimal CAD in the distal LM and proximal LAD/Lcx as outlined above.  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   TRANSTHORACIC ECHOCARDIOGRAM  06/2021   (In setting of A. fib)EF 60 to 65%.  Mild LAE. Mildly elevated RAP.    Immunization History  Administered Date(s) Administered   H1N1 07/12/2008, 07/12/2008   Influenza, High Dose Seasonal PF 05/19/2019   Influenza, Seasonal, Injecte, Preservative Fre 04/09/2011, 05/07/2012, 03/25/2013   Influenza,inj,Quad PF,6+ Mos 05/12/2018   Influenza,inj,quad, With Preservative 05/05/2014   Influenza-Unspecified 04/27/2008, 07/12/2008, 03/29/2009, 04/09/2011, 05/07/2012, 03/25/2013, 05/05/2014, 05/25/2015, 05/25/2016, 05/29/2017, 05/10/2020, 05/03/2021   PFIZER Comirnaty(Gray Top)Covid-19 Tri-Sucrose Vaccine 02/26/2021   PFIZER(Purple Top)SARS-COV-2 Vaccination 08/24/2019, 09/14/2019, 04/23/2020   Pfizer Covid-19 Vaccine Bivalent Booster 69yrs & up 06/15/2021   Pneumococcal Conjugate-13 12/30/2015  Pneumococcal Polysaccharide-23 01/21/2019   Tdap 02/10/2008, 10/11/2016   Zoster, Live 09/29/2012    MEDICATIONS/ALLERGIES   Current Meds  Medication Sig   apixaban (ELIQUIS) 5 MG TABS tablet Take 1 tablet (5 mg total) by mouth 2 (two) times daily.   Calcium Carbonate-Simethicone (ROLAIDS PLUS GAS RELIEF EX ST PO) Take 2 tablets by mouth daily as needed (heartburn/gas).   Cholecalciferol 50 MCG (2000 UT) TABS Take 2,000 Units by mouth daily.   diltiazem (CARDIZEM CD) 240 MG 24 hr capsule Take 1 capsule (240 mg total) by mouth daily.   lisinopril (ZESTRIL) 20 MG tablet Take 1 tablet (20 mg total) by mouth at bedtime.   rosuvastatin (CRESTOR) 10 MG tablet Take 10 mg by mouth at bedtime.   vitamin  B-12 (CYANOCOBALAMIN) 100 MCG tablet Take 100 mcg by mouth daily.    No Known Allergies  SOCIAL HISTORY/FAMILY HISTORY   Reviewed in Epic:  Pertinent findings:  Social History   Tobacco Use   Smoking status: Never   Smokeless tobacco: Never  Substance Use Topics   Alcohol use: Not Currently   Drug use: Not Currently   Social History   Social History Narrative   He routinely works out at [employer].  Exercises for least an hour or so just about every day.    OBJCTIVE -PE, EKG, labs   Wt Readings from Last 3 Encounters:  08/15/22 189 lb 12.8 oz (86.1 kg)  11/14/21 191 lb 3.2 oz (86.7 kg)  08/22/21 193 lb 6.4 oz (87.7 kg)    Physical Exam: BP (!) 140/82   Pulse 75   Ht 6' (1.829 m)   Wt 189 lb 12.8 oz (86.1 kg)   SpO2 96%   BMI 25.74 kg/m  Physical Exam Vitals reviewed.  Constitutional:      General: He is not in acute distress.    Appearance: Normal appearance. He is normal weight. He is not ill-appearing or toxic-appearing.  HENT:     Head: Normocephalic and atraumatic.  Neck:     Vascular: No carotid bruit.  Cardiovascular:     Rate and Rhythm: Normal rate and regular rhythm.     Pulses: Normal pulses.     Heart sounds: No murmur heard.    Friction rub present. No gallop.  Pulmonary:     Effort: Pulmonary effort is normal. No respiratory distress.     Breath sounds: Normal breath sounds. No wheezing or rales.  Chest:     Chest wall: No tenderness.  Musculoskeletal:        General: No swelling or tenderness.     Cervical back: Normal range of motion and neck supple.  Skin:    General: Skin is warm and dry.     Coloration: Skin is not jaundiced.  Neurological:     General: No focal deficit present.     Mental Status: He is alert and oriented to person, place, and time.     Gait: Gait normal.  Psychiatric:        Mood and Affect: Mood normal.        Behavior: Behavior normal.        Thought Content: Thought content normal.        Judgment: Judgment  normal.     Adult ECG Report  Rate: 75;  Rhythm: normal sinus rhythm and normal axis, intervals durations. ;   Narrative Interpretation: Stable  Recent Labs: Reviewed No results found for: "CHOL", "HDL", "LDLCALC", "LDLDIRECT", "TRIG", "CHOLHDL" Lab Results  Component Value Date   CREATININE 0.95 08/22/2021   BUN 16 08/22/2021   NA 139 08/22/2021   K 4.5 08/22/2021   CL 102 08/22/2021   CO2 26 08/22/2021      Latest Ref Rng & Units 07/26/2021    9:14 AM 07/10/2021    9:02 AM 07/02/2021    3:44 AM  CBC  WBC 4.0 - 10.5 K/uL 6.6  6.0  5.5   Hemoglobin 13.0 - 17.0 g/dL 16.7  16.8  15.0   Hematocrit 39.0 - 52.0 % 50.9  51.5  45.1   Platelets 150 - 400 K/uL 197  215  177     No results found for: "HGBA1C" Lab Results  Component Value Date   TSH 2.025 06/30/2021    ================================================== I spent a total of 20 minutes with the patient spent in direct patient consultation.  Additional time spent with chart review  / charting (studies, outside notes, etc): 16 min Total Time: 36 min  Current medicines are reviewed at length with the patient today.  (+/- concerns) none  Notice: This dictation was prepared with Dragon dictation along with smart phrase technology. Any transcriptional errors that result from this process are unintentional and may not be corrected upon review.  Studies Ordered:   Orders Placed This Encounter  Procedures   CT ANGIO CHEST AORTA W/CM & OR WO/CM   Basic metabolic panel   EKG [id number]   Meds ordered this encounter  Medications   diltiazem (CARDIZEM) 30 MG tablet    Sig: Take 1 tablet (30 mg total) by mouth every 8 (eight) hours as needed (HR>95 or afib ,).    Dispense:  20 tablet    Refill:  5    Patient Instructions / Medication Changes & Studies & Tests Ordered   Patient Instructions  Medication Instructions:   Refilled diltiazem 30 mg  may use if heart rate is greater than 95 or afb  *If you need a refill on  your cardiac medications before your next appointment, please call your pharmacy*   Lab Work: [facility] for Ct angio aorta If you have labs (blood work) drawn today and your tests are completely normal, you will receive your results only by: [place] (if you have MyChart) OR A paper copy in the mail If you have any lab test that is abnormal or we need to change your treatment, we will call you to review the results.   Testing/Procedures:  -Cardiac CT Angiography (CTA), is a special type of CT scan that uses a computer to produce multi-dimensional views of major blood vessels aorta. In CT angiography, a contrast material is injected through an IV to help visualize the blood vessels    Follow-Up: At [facility], you and your health needs are our priority.  As part of our continuing mission to provide you with exceptional heart care, we have created designated Provider Care Teams.  These Care Teams include your primary Cardiologist (physician) and Advanced Practice Providers (APPs -  Physician Assistants and Nurse Practitioners) who all work together to provide you with the care you need, when you need it.     Your next appointment:   12 month(s)  The format for your next appointment:   In Person  Provider:   Glenetta Hew, MD      Leonie Man, MD, MS Glenetta Hew, M.D., M.S. Interventional Cardiologist  [place]  Pager # [phone] Phone # [phone] [address]. [place], [place]  [address]   Thank you for choosing [facility] [place]!!

## 2022-08-15 NOTE — Patient Instructions (Addendum)
Medication Instructions:   Refilled diltiazem 30 mg  may use if heart rate is greater than 95 or afb  *If you need a refill on your cardiac medications before your next appointment, please call your pharmacy*   Lab Work: [facility] for Ct angio aorta If you have labs (blood work) drawn today and your tests are completely normal, you will receive your results only by: [place] (if you have MyChart) OR A paper copy in the mail If you have any lab test that is abnormal or we need to change your treatment, we will call you to review the results.   Testing/Procedures:  -Cardiac CT Angiography (CTA), is a special type of CT scan that uses a computer to produce multi-dimensional views of major blood vessels aorta. In CT angiography, a contrast material is injected through an IV to help visualize the blood vessels    Follow-Up: At [facility], you and your health needs are our priority.  As part of our continuing mission to provide you with exceptional heart care, we have created designated Provider Care Teams.  These Care Teams include your primary Cardiologist (physician) and Advanced Practice Providers (APPs -  Physician Assistants and Nurse Practitioners) who all work together to provide you with the care you need, when you need it.     Your next appointment:   12 month(s)  The format for your next appointment:   In Person  Provider:   Glenetta Hew, MD

## 2022-08-21 ENCOUNTER — Encounter: Payer: Self-pay | Admitting: Cardiology

## 2022-08-21 NOTE — Assessment & Plan Note (Signed)
CHA2DS2-VASc is 3 with age, HTN and vascular disease on Eliquis no bleeding issues.  Okay to hold Eliquis 48 hours prior to Mohs procedures with 72 hours for major/high risk procedures.

## 2022-08-21 NOTE — Assessment & Plan Note (Signed)
Should be due for labs to be checked soon.  Was well-controlled last visit.  He is due to get lipids checked by PCP soon.  Depending what those labs look like, would probably consider titrating up rosuvastatin dose to 20 mg nightly.

## 2022-08-21 NOTE — Assessment & Plan Note (Signed)
Noted on CT scan.  Respect modification of lipid and glycemic and blood pressure control recommended. On diltiazem and lisinopril for blood pressure as well as rosuvastatin for lipids

## 2022-08-21 NOTE — Assessment & Plan Note (Signed)
Thankfully, he is only had 1 episode lasting a few hours on his Kardia-Mobile in the last several months.  Did not last long however did feel like he needed to take extra PRN dose of diltiazem.  Now remains on STEMI does not thousand 240 mg daily but we will refill the breakthrough short acting if necessary.  Remains on Eliquis with no bleeding issues.

## 2022-08-31 ENCOUNTER — Telehealth: Payer: Self-pay | Admitting: Cardiology

## 2022-08-31 NOTE — Telephone Encounter (Signed)
Spoke with pt regarding him having an episode of a-fib. Pt states that he knows when he is in a-fib typically and his [employer] mobile confirmed that he is in a-fib. Pt states that he is also having issues with his blood pressure being elevated. Pt mentioned some elevated readings to his PCP (Dr. Hoy Morn) on Wedneday 1/31 and he was told to take lisinopril 20mg  BID instead of just once daily. Pt did this for yesterday and today- blood pressures have been 94/68, 87/64. Pt is also taking diltiazem 30mg  every 8 hours since going back into a-fib yesterday morning. Will discuss with Dr. Ellyn Hack for recommendations.

## 2022-08-31 NOTE — Telephone Encounter (Signed)
Patient c/o Palpitations:  High priority if patient c/o lightheadedness, shortness of breath, or chest pain  How long have you had palpitations/irregular HR/ Afib? Are you having the symptoms now? States afib started yesterday morning   Are you currently experiencing lightheadedness, SOB or CP? No   Do you have a history of afib (atrial fibrillation) or irregular heart rhythm? no  Have you checked your BP or HR? (document readings if available):  160/97  152/89 140/90  94/68 87/64 100/69 Are you experiencing any other symptoms?   States he saw his PCP on Wednesday and he suggested he take Lisinopril 2x day, instead of just once a day because his bp was high. He also stated that he went ahead and started taking diltazem every 8 hours. He said his [place] mobile is still telling him he is in afib. Please advise.

## 2022-08-31 NOTE — Telephone Encounter (Signed)
Called pt back to discuss Dr. Darcus Pester recommendations. Per Dr. Ellyn Hack pt should not take lisinopril 20mg  BID, he recommends that he take only once daily like he had been doing. Pt will continue to take diliazem 30mg  every 8 hours while in a-fib. Pt will continue to monitor his blood pressure. Dr. Ellyn Hack states that if his blood pressure in between 286-381 systolic consistently he should call the office. Pt also wants to know about recent recommendations from his PCP on increasing rosuvastatin to 20mg  daily. Per Dr. Ellyn Hack ok with this increase. Pt can do this now or can wait until blood pressure issues have resolved to start. Pt verbalizes understanding.

## 2022-09-03 IMAGING — CT CT HEART MORP W/ CTA COR W/ SCORE W/ CA W/CM &/OR W/O CM
4 of 7 series · 8 of 20 positions shown, 9 images · IV contrast (APPLIED)
Comparison: CTA chest 06/30/2021.

Addendum:
CLINICAL DATA: 70 yo male with chest pain

EXAM:
Cardiac/Coronary CTA
TECHNIQUE: A non-contrast, gated CT scan was obtained with axial slices of 3 mm
through the heart for calcium scoring. Calcium scoring was performed
using the Agatston method. A 120 kV prospective, gated, contrast
cardiac scan was obtained. Gantry rotation speed was 250 msecs and
collimation was 0.6 mm. Two sublingual nitroglycerin tablets (0.8
mg) were given. The 3D data set was reconstructed in 5% intervals of
the 35-75% of the R-R cycle. Diastolic phases were analyzed on a
dedicated workstation using MPR, MIP, and VRT modes. The patient
received 95 cc of contrast.

[Series 6: best syst · axial · 0.39mm/px · z∈[-68,-23]mm · 2 of 343 slices shown, 3 images]
[im 115/343  vessel]
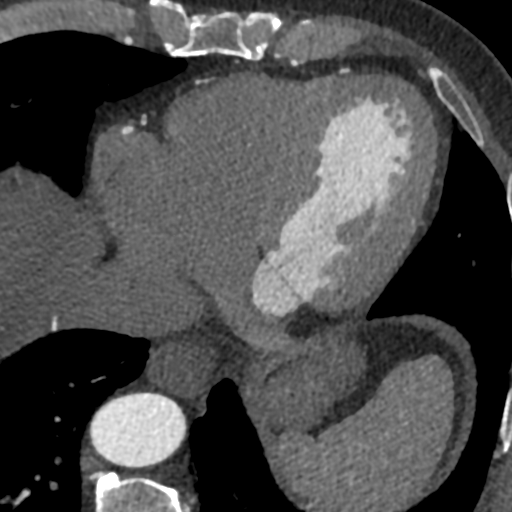
[im 115/343  lung]
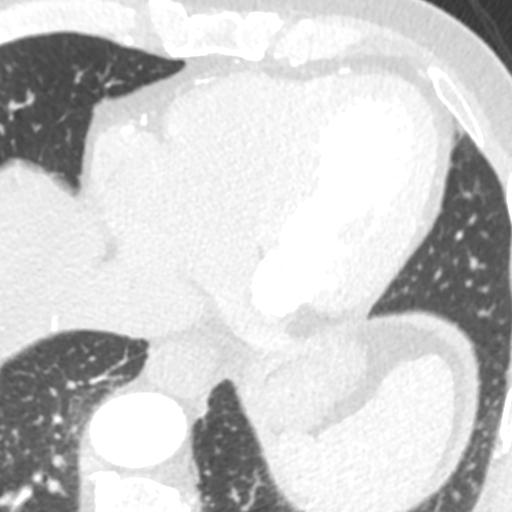
[im 229/343  vessel]
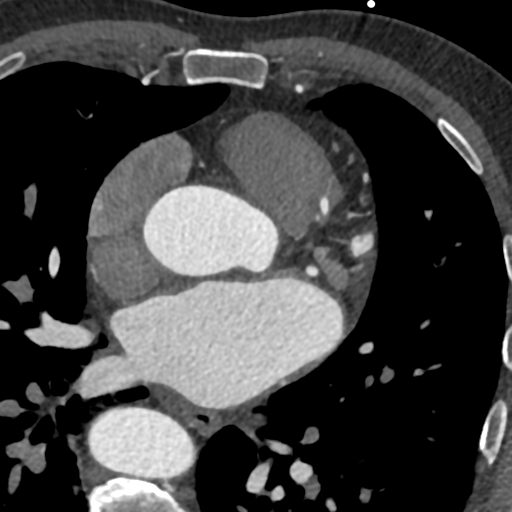

[Series 7: best diast · axial · 0.39mm/px · z∈[-68,-23]mm · 2 of 343 slices shown]
[im 115/343  vessel]
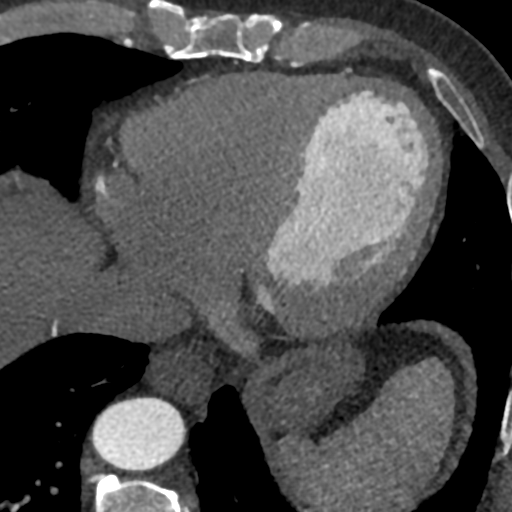
[im 229/343  vessel]
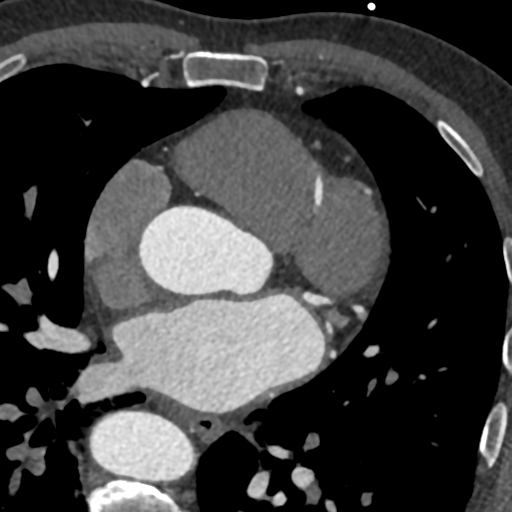

[Series 8: ts diast sharp · axial · 0.39mm/px · z∈[-68,-23]mm · 2 of 343 slices shown]
[im 115/343  lung]
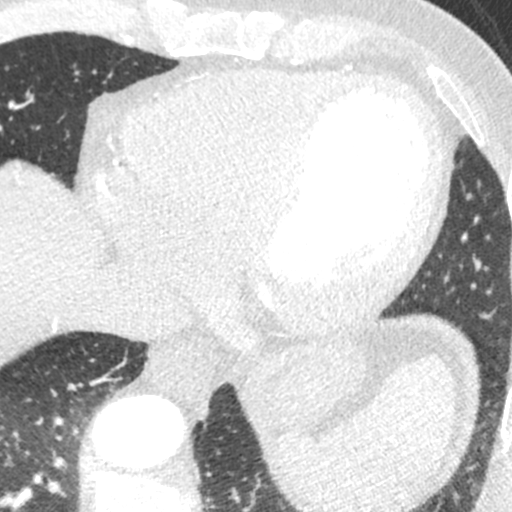
[im 229/343  lung]
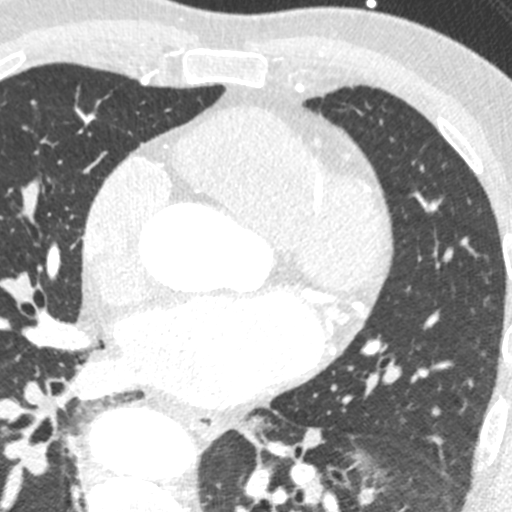

[Series 9: ts syst sharp · axial · 0.39mm/px · z∈[-68,-23]mm · 2 of 343 slices shown]
[im 115/343  lung]
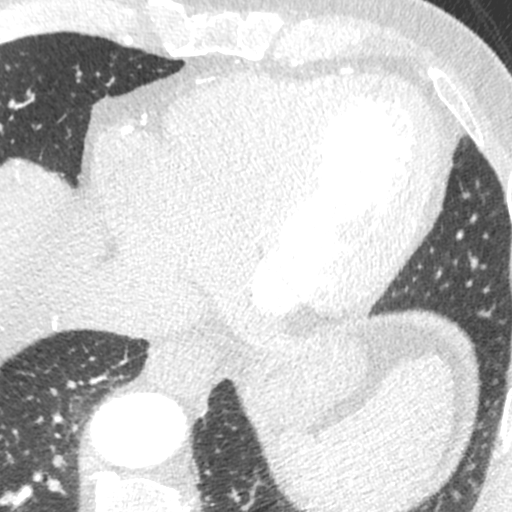
[im 229/343  lung]
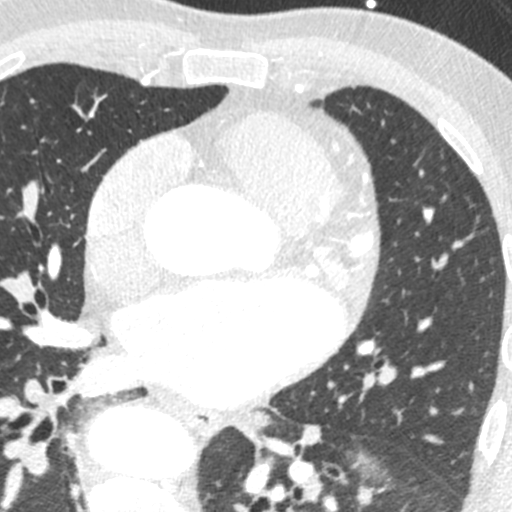

[8 of 20 positions shown; findings below may reference images not displayed]

FINDINGS: Image quality: Average.

Noise artifact is: Limited.

Coronary Arteries:  Normal coronary origin.  Right dominance.

Left main: The left main is a large caliber vessel with a normal
take off from the left coronary cusp that bifurcates to form a left
anterior descending artery and a left circumflex artery. There is
minimal (0-24) calcified plaque in the distal vessel extending into
the LAD and Lcx.

Left anterior descending artery: The LAD has minimal (0-24)
calcified plaque in the proximal vessel. The LAD gives off 2 patent
diagonal branches.

Left circumflex artery: The LCX is non-dominant and patent with no
evidence of plaque or stenosis. The LCX gives off large, patent OM1
and smaller patent OM2.

Right coronary artery: The RCA is dominant with normal take off from
the right coronary cusp. There is no evidence of plaque or stenosis.
The RCA gives off RV branch and then terminates as a PDA and right
posterolateral branch without evidence of plaque or stenosis.

Right Atrium: Right atrial size is within normal limits.

Right Ventricle: The right ventricular cavity is within normal
limits.

Left Atrium: Left atrial size is normal in size with no left atrial
appendage filling defect.

Left Ventricle: The ventricular cavity size is within normal limits.
There are no stigmata of prior infarction. There is no abnormal
filling defect.

Pulmonary arteries: Mildly dilated (30 mm).

Pulmonary veins: Normal pulmonary venous drainage.

Pericardium: Normal thickness with no significant effusion or
calcium present.

Cardiac valves: The aortic valve is trileaflet without significant
calcification. The mitral valve is normal structure without
significant calcification.

Aorta: Dilated ascending aorta (41 mm); aortic atherosclerosis.

Extra-cardiac findings: See attached radiology report for
non-cardiac structures.
IMPRESSION: 1. Coronary calcium score of 176. This was 57 percentile for age-,
sex, and race-matched controls.

2. Normal coronary origin with right dominance.

3. Minimal CAD in the distal LM and proximal LAD/Lcx as outlined
above.

4. Dilated ascending aorta (41 mm).

5. Aortic atherosclerosis.

6. Mildly dilated pulmonary artery (30 mm).

RECOMMENDATIONS:
CAD-RADS 1: Minimal non-obstructive CAD (0-24%). Consider
non-atherosclerotic causes of chest pain. Consider preventive
therapy and risk factor modification.

EXAM:
OVER-READ INTERPRETATION  CT CHEST

The following report is an over-read performed by radiologist Dr.
does not include interpretation of cardiac or coronary anatomy or
pathology. The coronary CTA interpretation by the cardiologist is
attached.
FINDINGS: No enlarged mediastinal lymph nodes or mass. Visualized portions of
the lower airway are unremarkable. Small hiatal hernia noted.

No pleural effusion, airspace consolidation or atelectasis. Small, 2
mm, right middle lobe nodule is identified, image [DATE]. Unchanged.

No acute abnormality within the imaged portions of the upper
abdomen.

No acute or suspicious osseous findings. Degenerative disc disease
noted within the lumbar spine.
IMPRESSION: 1. No acute cardiopulmonary abnormalities.
2. Small hiatal hernia.
3. Small, 2 mm, right middle lobe nodule is identified. No follow-up
needed if patient is low-risk (and has no known or suspected primary
neoplasm). Non-contrast chest CT can be considered in 12 months if
patient is high-risk. This recommendation follows the consensus
statement: Guidelines for Management of Incidental Pulmonary Nodules
Detected on CT Images: From the [HOSPITAL] 4676; Radiology
4676; [DATE].

*** End of Addendum ***
FINDINGS: Image quality: Average.

Noise artifact is: Limited.

Coronary Arteries:  Normal coronary origin.  Right dominance.

Left main: The left main is a large caliber vessel with a normal
take off from the left coronary cusp that bifurcates to form a left
anterior descending artery and a left circumflex artery. There is
minimal (0-24) calcified plaque in the distal vessel extending into
the LAD and Lcx.

Left anterior descending artery: The LAD has minimal (0-24)
calcified plaque in the proximal vessel. The LAD gives off 2 patent
diagonal branches.

Left circumflex artery: The LCX is non-dominant and patent with no
evidence of plaque or stenosis. The LCX gives off large, patent OM1
and smaller patent OM2.

Right coronary artery: The RCA is dominant with normal take off from
the right coronary cusp. There is no evidence of plaque or stenosis.
The RCA gives off RV branch and then terminates as a PDA and right
posterolateral branch without evidence of plaque or stenosis.

Right Atrium: Right atrial size is within normal limits.

Right Ventricle: The right ventricular cavity is within normal
limits.

Left Atrium: Left atrial size is normal in size with no left atrial
appendage filling defect.

Left Ventricle: The ventricular cavity size is within normal limits.
There are no stigmata of prior infarction. There is no abnormal
filling defect.

Pulmonary arteries: Mildly dilated (30 mm).

Pulmonary veins: Normal pulmonary venous drainage.

Pericardium: Normal thickness with no significant effusion or
calcium present.

Cardiac valves: The aortic valve is trileaflet without significant
calcification. The mitral valve is normal structure without
significant calcification.

Aorta: Dilated ascending aorta (41 mm); aortic atherosclerosis.

Extra-cardiac findings: See attached radiology report for
non-cardiac structures.
IMPRESSION: 1. Coronary calcium score of 176. This was 57 percentile for age-,
sex, and race-matched controls.

2. Normal coronary origin with right dominance.

3. Minimal CAD in the distal LM and proximal LAD/Lcx as outlined
above.

4. Dilated ascending aorta (41 mm).

5. Aortic atherosclerosis.

6. Mildly dilated pulmonary artery (30 mm).

RECOMMENDATIONS:
CAD-RADS 1: Minimal non-obstructive CAD (0-24%). Consider
non-atherosclerotic causes of chest pain. Consider preventive
therapy and risk factor modification.

## 2022-09-03 NOTE — Telephone Encounter (Signed)
Called patient, advised that he had questions about how to proceed with the AFIB.   He states he is currently still in AFIB. He advised that his blood pressures improved so he stopped his medication- he states that he will start back on the Diltazem as the message below reads every 8 hours while in AFIB. Patient verbalized understanding. He states he will start back, and monitor his BP/HR and will call back if things do not improve.   Patient verbalized understanding, he is not currently having any symptoms.

## 2022-09-03 NOTE — Telephone Encounter (Signed)
The main question is how symptomatic he is here while he is in A-fib?  What his heart rates, and how is he feeling?  If his heart rates are controlled with the diltiazem, and he is not having any symptoms of shortness of breath, chest pain or dizziness, then I would just monitor, however if he is symptomatic, then we can consider whether he needs cardioversion.  It may be that if he is taking the extra doses of diltiazem while in A-fib, he holds his lisinopril. If he starts having these longer spells of A-fib, we may want to consider a pill in the pocket medicine like flecainide for breakthrough spells.  Unless I can get you in to see me soon, perhaps we can get you into the A-fib clinic and they can talk about as needed pill in the pocket medicines like flecainide to try to cardiovert you out of A-fib if episodes like this happen.  The recommendation of increasing the statin was based on recommendations from my note to your PCP.   Glenetta Hew, MD

## 2022-09-03 NOTE — Telephone Encounter (Signed)
Patient would like a call back to discuss if there is anything else he needs to be doing regarding his Afib.

## 2022-09-04 ENCOUNTER — Ambulatory Visit (HOSPITAL_COMMUNITY)
Admission: RE | Admit: 2022-09-04 | Discharge: 2022-09-04 | Disposition: A | Discharge status: Home / Self Care | Payer: Medicare PPO | Source: Ambulatory Visit | Attending: Nurse Practitioner | Admitting: Nurse Practitioner

## 2022-09-04 VITALS — BP 142/76 | HR 88 | Ht 72.0 in | Wt 191.0 lb

## 2022-09-04 DIAGNOSIS — Z7901 Long term (current) use of anticoagulants: Secondary | ICD-10-CM | POA: Insufficient documentation

## 2022-09-04 DIAGNOSIS — E785 Hyperlipidemia, unspecified: Secondary | ICD-10-CM | POA: Insufficient documentation

## 2022-09-04 DIAGNOSIS — D6869 Other thrombophilia: Secondary | ICD-10-CM

## 2022-09-04 DIAGNOSIS — I10 Essential (primary) hypertension: Secondary | ICD-10-CM | POA: Diagnosis not present

## 2022-09-04 DIAGNOSIS — Z79899 Other long term (current) drug therapy: Secondary | ICD-10-CM | POA: Diagnosis not present

## 2022-09-04 DIAGNOSIS — I4819 Other persistent atrial fibrillation: Secondary | ICD-10-CM | POA: Insufficient documentation

## 2022-09-04 DIAGNOSIS — I2510 Atherosclerotic heart disease of native coronary artery without angina pectoris: Secondary | ICD-10-CM | POA: Insufficient documentation

## 2022-09-04 LAB — BASIC METABOLIC PANEL
Anion gap: 10 (ref 5–15)
BUN: 15 mg/dL (ref 8–23)
CO2: 22 mmol/L (ref 22–32)
Calcium: 8.8 mg/dL — ABNORMAL LOW (ref 8.9–10.3)
Chloride: 105 mmol/L (ref 98–111)
Creatinine, Ser: 1.02 mg/dL (ref 0.61–1.24)
GFR, Estimated: >60 mL/min (ref >60)
Glucose, Bld: 114 mg/dL — ABNORMAL HIGH (ref 70–99)
Potassium: 3.7 mmol/L (ref 3.5–5.1)
Sodium: 137 mmol/L (ref 135–145)

## 2022-09-04 LAB — CBC
HCT: 48.2 % (ref 39.0–52.0)
Hemoglobin: 16 g/dL (ref 13.0–17.0)
MCH: 31.2 pg (ref 26.0–34.0)
MCHC: 33.2 g/dL (ref 30.0–36.0)
MCV: 94 fL (ref 80.0–100.0)
Platelets: 188 10*3/uL (ref 150–400)
RBC: 5.13 MIL/uL (ref 4.22–5.81)
RDW: 13 % (ref 11.5–15.5)
WBC: 6.5 10*3/uL (ref 4.0–10.5)
nRBC: 0 % (ref 0.0–0.2)

## 2022-09-04 NOTE — Telephone Encounter (Signed)
Patient is aware . Direction given will be there at 2 pm for a 2:30 appointment

## 2022-09-04 NOTE — Patient Instructions (Addendum)
Cardioversion scheduled for: Tuesday, February 27th   - Arrive at the [facility] and go to admitting at 930am   - Do not eat or drink anything after midnight the night prior to your procedure.   - Take all your morning medication (except diabetic medications) with a sip of water prior to arrival.  - You will not be able to drive home after your procedure.    - Do NOT miss any doses of your blood thinner - if you should miss a dose please notify our office immediately.   - If you feel as if you go back into normal rhythm prior to scheduled cardioversion, please notify our office immediately.  If your procedure is canceled in the cardioversion suite you will be charged a cancellation fee.

## 2022-09-04 NOTE — Progress Notes (Signed)
Primary Care Physician: Jonathon Bellows, PA-C Primary Cardiologist: Dr Ellyn Hack (new) Primary Electrophysiologist: none Referring Physician: Dr Carles Collet Tierno is a 72 y.o. male with a history of HTN, HLD, CAD on CT, atrial fibrillation who presents for follow up in the [facility].  The patient was initially diagnosed with atrial fibrillation 06/30/21 after presenting to his PCP with increasing palpitations and exercise intolerance. ECG showed afib with RVR and he was sent to the ED. He was started on diltiazem for rate control and Eliquis for a CHADS2VASC score of 3. Echo showed preserved EF 60-65%, mild LAE. He denies significant alcohol use or snoring.   On follow up today, patient is s/p DCCV on 07/27/21. He reports that he has felt well since the procedure with no palpitations. He does not get as winded while exercising. He denies any bleeding issues on anticoagulation.   F/u in afib clinic, 09/04/22,  as he went into afib last Thursday. No trigger identified. For the most part, his afib has been controlled less than 100 bpm. No missed anticoagulation. He has taken 1-2 30 mg cardizem without much effect . Overall is tolerating ok. He is scheduled for a f/u CT angio chest for early nest week but I feel this will need to be rescheduled.last cardioversion was 07/27/21. He had a very short spell of afib in November of 2023.   Today, he denies symptoms of palpitations, chest pain, shortness of breath, orthopnea, PND, lower extremity edema, dizziness, presyncope, syncope, snoring, daytime somnolence, bleeding, or neurologic sequela. The patient is tolerating medications without difficulties and is otherwise without complaint today.    Atrial Fibrillation Risk Factors:  he does not have symptoms or diagnosis of sleep apnea. he does not have a history of rheumatic fever. he does not have a history of alcohol use. The patient does not have a history  of early familial atrial fibrillation or other arrhythmias.  he has a BMI of Body mass index is 25.9 kg/m.Marland Kitchen Filed Weights   09/04/22 1329  Weight: 86.6 kg     No family history on file.   Atrial Fibrillation Management history:  Previous antiarrhythmic drugs: none Previous cardioversions: 07/27/21 Previous ablations: none CHADS2VASC score: 3 Anticoagulation history: Eliquis   Past Medical History:  Diagnosis Date   Allergic rhinitis    Aortic atherosclerosis ([place]) 06/2021   Noted on CT scan   BPH (benign prostatic hyperplasia)    Coronary artery calcification seen on CAT scan 06/2021   GERD (gastroesophageal reflux disease)    Hyperlipidemia    Hypertension    Persistent atrial fibrillation ([place]) 06/30/2021   CHA2DS2-VASc score 3-on Eliquis and diltiazem   Past Surgical History:  Procedure Laterality Date   CARDIOVERSION N/A 07/27/2021   Procedure: CARDIOVERSION;  Surgeon: Pixie Casino, MD;  Location: [facility] END[facility]COPY;  Service: Cardiovascular;  Laterality: N/A;   CT CTA CORONARY W/CA SCORE W/CM &/OR WO/CM  08/31/2021   Coronary calcium score of 176.  Normal coronary origin with right dominance. Minimal CAD in the distal LM and proximal LAD/Lcx as outlined above.  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   TRANSTHORACIC ECHOCARDIOGRAM  06/2021   (In setting of A. fib)EF 60 to 65%.  Mild LAE. Mildly elevated RAP.    Current Outpatient Medications  Medication Sig Dispense Refill   apixaban (ELIQUIS) 5 MG TABS tablet Take 1 tablet (5 mg total) by mouth 2 (  two) times daily. 180 tablet 1   Cholecalciferol 50 MCG (2000 UT) TABS Take 2,000 Units by mouth daily.     diltiazem (CARDIZEM CD) 240 MG 24 hr capsule Take 1 capsule (240 mg total) by mouth daily. 30 capsule 11   diltiazem (CARDIZEM) 30 MG tablet Take 1 tablet (30 mg total) by mouth every 8 (eight) hours as needed (HR>95 or afib ,). 20 tablet 5    lisinopril (ZESTRIL) 20 MG tablet Take 1 tablet (20 mg total) by mouth at bedtime. 90 tablet 3   rosuvastatin (CRESTOR) 10 MG tablet Take 10 mg by mouth at bedtime.     vitamin B-12 (CYANOCOBALAMIN) 100 MCG tablet Take 100 mcg by mouth daily.     No current facility-administered medications for this encounter.    No Known Allergies  Social History   Socioeconomic History   Marital status: Single    Spouse name: Not on file   Number of children: Not on file   Years of education: Not on file   Highest education level: Not on file  Occupational History   Not on file  Tobacco Use   Smoking status: Never   Smokeless tobacco: Never  Substance and Sexual Activity   Alcohol use: Not Currently   Drug use: Not Currently   Sexual activity: Not on file  Other Topics Concern   Not on file  Social History Narrative   He routinely works out at [employer].  Exercises for least an hour or so just about every day.   Social Determinants of Health   Financial Resource Strain: Not on file  Food Insecurity: Not on file  Transportation Needs: Not on file  Physical Activity: Not on file  Stress: Not on file  Social Connections: Not on file  Intimate Partner Violence: Not on file     ROS- All systems are reviewed and negative except as per the HPI above.  Physical Exam: Vitals:   09/04/22 1329  BP: (!) 142/76  Pulse: 88  Weight: 86.6 kg  Height: 6' (1.829 m)    GEN- The patient is a well appearing male, alert and oriented x 3 today.   HEENT-head normocephalic, atraumatic, sclera clear, conjunctiva pink, hearing intact, trachea midline. Lungs- Clear to ausculation bilaterally, normal work of breathing Heart- irregular rate and rhythm, no murmurs, rubs or gallops  GI- soft, NT, ND, + BS Extremities- no clubbing, cyanosis, or edema MS- no significant deformity or atrophy Skin- no rash or lesion Psych- euthymic mood, full affect Neuro- strength and sensation are intact   Wt  Readings from Last 3 Encounters:  09/04/22 86.6 kg  08/15/22 86.1 kg  11/14/21 86.7 kg    EKG today demonstrates  Vent. rate 88 BPM PR interval * ms QRS duration 88 ms QT/QTcB 356/430 ms P-R-T axes * -3 12 Atrial fibrillation Abnormal ECG When compared with ECG of 03-Aug-2021 09:51, PREVIOUS ECG IS PRESENT  Echo 07/01/21 demonstrated  1. Left ventricular ejection fraction, by estimation, is 60 to 65%. The  left ventricle has normal function. The left ventricle has no regional  wall motion abnormalities. Left ventricular diastolic parameters are  indeterminate.   2. Right ventricular systolic function is normal. The right ventricular  size is normal. Tricuspid regurgitation signal is inadequate for assessing PA pressure.   3. Left atrial size was mildly dilated.   4. The mitral valve is normal in structure. No evidence of mitral valve  regurgitation.   5. The aortic valve is normal  in structure. Aortic valve regurgitation is  mild. mild focal calcification.   6. The inferior vena cava is dilated in size with >50% respiratory  variability, suggesting right atrial pressure of 8 mmHg.   Conclusion(s)/Recommendation(s): Otherwise normal echocardiogram, with minor abnormalities described in the report.   Epic records are reviewed at length today  CHA2DS2-VASc Score =  3  The patient's score is based upon:          ASSESSMENT AND PLAN: 1. Persistent Atrial Fibrillation (ICD10:  I48.19)   S/p DCCV on 07/27/21 Went into afib last Thursday and will plan for cardioversion, he is in agreement   Continue Eliquis 5 mg BID for a CHA2DS2VASc  score of 3, states no missed doses x 3 weeks  Continue diltiazem 240 mg daily with 30 mg PRN q 4 hours for heart racing over 100 bpm in afib if systolic is over 956   2. Secondary Hypercoagulable State (ICD10:  D68.69) The patient is at significant risk for stroke/thromboembolism based upon his CHA2DS2-VASc Score of 3 .  Continue Apixaban  (Eliquis).   3. HTN Stable, no changes today.  4. Aortic atherosclerosis/CAD Noted on CT, scheduled for repeat CT on 08/30/22, but notify Dr. Allison Quarry  office to have this rescheduled as he is in afib with elevated heart rates  On rosuvastatin  No anginal symptoms.   I will refer to EP to discuss front line ablation    Butch Penny C. Adalyn Pennock, [facility] [address] [place], [place] [address] [phone]

## 2022-09-04 NOTE — Telephone Encounter (Signed)
Spoke to patient . Patient still feels like he is in  Afib.   He  occasional notice he is not in rhythm  - " quivering sensation .    He has been taking extra dose of Diltiazem short acting. Patient aware not to take  Lisinopril if he would be taking extra dose of Diltiazem ,   Patient is available today to see Afib clinic if opening available

## 2022-09-06 ENCOUNTER — Encounter (HOSPITAL_COMMUNITY): Payer: Self-pay | Admitting: *Deleted

## 2022-09-10 ENCOUNTER — Ambulatory Visit (HOSPITAL_COMMUNITY): Payer: Medicare PPO

## 2022-09-14 ENCOUNTER — Encounter: Payer: Self-pay | Admitting: Cardiology

## 2022-09-14 ENCOUNTER — Ambulatory Visit: Payer: Medicare PPO | Attending: Cardiology | Admitting: Cardiology

## 2022-09-14 VITALS — BP 132/76 | HR 84 | Ht 72.0 in | Wt 191.0 lb

## 2022-09-14 DIAGNOSIS — D6869 Other thrombophilia: Secondary | ICD-10-CM

## 2022-09-14 DIAGNOSIS — I4819 Other persistent atrial fibrillation: Secondary | ICD-10-CM

## 2022-09-14 DIAGNOSIS — I10 Essential (primary) hypertension: Secondary | ICD-10-CM

## 2022-09-14 MED ORDER — FLECAINIDE ACETATE 50 MG PO TABS: 50.0000 mg | ORAL_TABLET | Freq: Two times a day (BID) | ORAL | 3 refills | Status: DC

## 2022-09-14 NOTE — H&P (View-Only) (Signed)
 Electrophysiology Office Note   Date:  09/14/2022   ID:  Albert [name], DOB 04/25/1951, MRN [id number]  PCP:  [name], [name], PA-C  Cardiologist:  Harding Primary Electrophysiologist:  Ji Fairburn Martin Arbie Reisz, MD    Chief Complaint: AF   History of Present Illness: Albert [name] is a 71 y.o. male who is being seen today for the evaluation of AF at the request of Carroll, Donna C, NP. Presenting today for electrophysiology evaluation.  History seen for hypertension, hyperlipidemia, coronary calcifications, atrial fibrillation.  Albert was diagnosed with atrial fibrillation 12-22 after presenting with palpitations and exercise intolerance.  Albert [name] presented to A-fib clinic 09/04/2022 and atrial fibrillation without a known trigger.  Heart rates were generally less than 100 bpm.  Albert felt symptoms of palpitations  Today, Albert denies symptoms of chest pain, shortness of breath, orthopnea, PND, lower extremity edema, claudication, dizziness, presyncope, syncope, bleeding, or neurologic sequela. The patient is tolerating medications without difficulties.    Past Medical History:  Diagnosis Date   Allergic rhinitis    Aortic atherosclerosis (HCC) 06/2021   Noted on CT scan   BPH (benign prostatic hyperplasia)    Coronary artery calcification seen on CAT scan 06/2021   GERD (gastroesophageal reflux disease)    Hyperlipidemia    Hypertension    Persistent atrial fibrillation (HCC) 06/30/2021   CHA2DS2-VASc score 3-on Eliquis and diltiazem   Past Surgical History:  Procedure Laterality Date   CARDIOVERSION N/A 07/27/2021   Procedure: CARDIOVERSION;  Surgeon: Hilty, Ly C, MD;  Location: [facility];  Service: Cardiovascular;  Laterality: N/A;   CT CTA CORONARY W/CA SCORE W/CM &/OR WO/CM  08/31/2021   Coronary calcium score of 176.  Normal coronary origin with right dominance. Minimal CAD in the distal LM and proximal LAD/Lcx as outlined above.  Dilated ascending aorta (41 mm)  with Aortic atherosclerosis.  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   TRANSTHORACIC ECHOCARDIOGRAM  06/2021   (In setting of A. fib)EF 60 to 65%.  Mild LAE. Mildly elevated RAP.     Current Outpatient Medications  Medication Sig Dispense Refill   apixaban (ELIQUIS) 5 MG TABS tablet Take 1 tablet (5 mg total) by mouth 2 (two) times daily. 180 tablet 1   Cholecalciferol 50 MCG (2000 UT) TABS Take 2,000 Units by mouth daily.     diltiazem (CARDIZEM CD) 240 MG 24 hr capsule Take 1 capsule (240 mg total) by mouth daily. 30 capsule 11   diltiazem (CARDIZEM) 30 MG tablet Take 1 tablet (30 mg total) by mouth every 8 (eight) hours as needed (HR>95 or afib ,). 20 tablet 5   flecainide (TAMBOCOR) 50 MG tablet Take 1 tablet (50 mg total) by mouth 2 (two) times daily. 60 tablet 3   lisinopril (ZESTRIL) 20 MG tablet Take 1 tablet (20 mg total) by mouth at bedtime. 90 tablet 3   rosuvastatin (CRESTOR) 20 MG tablet Take 20 mg by mouth daily.     vitamin B-12 (CYANOCOBALAMIN) 100 MCG tablet Take 100 mcg by mouth daily.     No current facility-administered medications for this visit.    Allergies:   Patient has no known allergies.   Social History:  The patient  reports that Albert has never smoked. Albert has never used smokeless tobacco. Albert reports that Albert does not currently use alcohol. Albert reports that Albert does not currently use drugs.   Family History:  The patient's family history is not on file.    ROS:  Please   see the history of present illness.   Otherwise, review of systems is positive for none.   All other systems are reviewed and negative.    PHYSICAL EXAM: VS:  BP 132/76   Pulse 84   Ht 6' (1.829 m)   Wt 191 lb (86.6 kg)   SpO2 94%   BMI 25.90 kg/m  , BMI Body mass index is 25.9 kg/m. GEN: Well nourished, well developed, in no acute distress  HEENT: normal  Neck: no JVD, carotid bruits, or masses Cardiac: irregular; no murmurs, rubs, or gallops,no edema  Respiratory:  clear to  auscultation bilaterally, normal work of breathing GI: soft, nontender, nondistended, + BS MS: no deformity or atrophy  Skin: warm and dry Neuro:  Strength and sensation are intact Psych: euthymic mood, full affect  EKG:  EKG is not ordered today. Personal review of the ekg ordered 09/04/22 shows AF, rate 88  Recent Labs: 09/04/2022: BUN 15; Creatinine, Ser 1.02; Hemoglobin 16.0; Platelets 188; Potassium 3.7; Sodium 137    Lipid Panel  No results found for: "CHOL", "TRIG", "HDL", "CHOLHDL", "VLDL", "LDLCALC", "LDLDIRECT"   Wt Readings from Last 3 Encounters:  09/14/22 191 lb (86.6 kg)  09/04/22 191 lb (86.6 kg)  08/15/22 189 lb 12.8 oz (86.1 kg)      Other studies Reviewed: Additional studies/ records that were reviewed today include: TTE 07/01/21  Review of the above records today demonstrates:   1. Left ventricular ejection fraction, by estimation, is 60 to 65%. The  left ventricle has normal function. The left ventricle has no regional  wall motion abnormalities. Left ventricular diastolic parameters are  indeterminate.   2. Right ventricular systolic function is normal. The right ventricular  size is normal. Tricuspid regurgitation signal is inadequate for assessing  PA pressure.   3. Left atrial size was mildly dilated.   4. The mitral valve is normal in structure. No evidence of mitral valve  regurgitation.   5. The aortic valve is normal in structure. Aortic valve regurgitation is  mild. mild focal calcification.   6. The inferior vena cava is dilated in size with >50% respiratory  variability, suggesting right atrial pressure of 8 mmHg.     ASSESSMENT AND PLAN:  1.  Persistent atrial fibrillation: Currently on Eliquis, diltiazem, doses above.  CHA2DS2-VASc of 3.  Albert is in atrial fibrillation today.  Albert has palpitations but not much weakness or fatigue.  Albert has a cardioversion scheduled upcoming.  Albert prefer a rhythm control strategy.  We discussed medication options  versus ablation.  At this point, Albert would prefer to avoid procedures if possible.  Albert does have coronary artery disease noted on CTA, but no obstructive disease.  Mylynn Dinh start flecainide 50 mg twice daily.  Shelina Luo have him follow-up after his cardioversion for an EKG and to ensure Albert remains in rhythm.  2.  Second hypercoagulable state: Currently on Eliquis for atrial fibrillation as above  3.  Hypertension: Mildly elevated but usually well-controlled.  4.  Coronary atherosclerosis: Noted on prior CT scan.  Plan per primary cardiology.    Current medicines are reviewed at length with the patient today.   The patient does not have concerns regarding his medicines.  The following changes were made today: Start flecainide  Labs/ tests ordered today include:  No orders of the defined types were placed in this encounter.    Disposition:   FU with Elaine Middleton 3 months  Signed, Dandrae Kustra Martin Linnet Bottari, MD  09/14/2022 9:17 AM       [facility] [address] [place] [address] [phone] (office) [phone] (fax)  

## 2022-09-14 NOTE — Progress Notes (Signed)
Electrophysiology Office Note   Date:  09/14/2022   ID:  Albert [name], DOB July 22, 1951, MRN [id number]  PCP:  Albert Bellows, PA-C  Cardiologist:  Albert [name] Primary Electrophysiologist:  Albert Nell [place]eredith Leeds, MD    Chief Complaint: AF   History of Present Illness: Albert [name] is a 72 y.o. male who is being seen today for the evaluation of AF at the request of Albert Needs, NP. Presenting today for electrophysiology evaluation.  History seen for hypertension, hyperlipidemia, coronary calcifications, atrial fibrillation.  He was diagnosed with atrial fibrillation 12-22 after presenting with palpitations and exercise intolerance.  He Tama Gander presented to A-fib clinic 09/04/2022 and atrial fibrillation without a known trigger.  Heart rates were generally less than 100 bpm.  He felt symptoms of palpitations  Today, he denies symptoms of chest pain, shortness of breath, orthopnea, PND, lower extremity edema, claudication, dizziness, presyncope, syncope, bleeding, or neurologic sequela. The patient is tolerating medications without difficulties.    Past Medical History:  Diagnosis Date   Allergic rhinitis    Aortic atherosclerosis ([place]) 06/2021   Noted on CT scan   BPH (benign prostatic hyperplasia)    Coronary artery calcification seen on CAT scan 06/2021   GERD (gastroesophageal reflux disease)    Hyperlipidemia    Hypertension    Persistent atrial fibrillation ([place]) 06/30/2021   CHA2DS2-VASc score 3-on Eliquis and diltiazem   Past Surgical History:  Procedure Laterality Date   CARDIOVERSION N/A 07/27/2021   Procedure: CARDIOVERSION;  Surgeon: Pixie Casino, MD;  Location: [facility] ENDOSCOPY;  Service: Cardiovascular;  Laterality: N/A;   CT CTA CORONARY W/CA SCORE W/CM &/OR WO/CM  08/31/2021   Coronary calcium score of 176.  Normal coronary origin with right dominance. Minimal CAD in the distal LM and proximal LAD/Lcx as outlined above.  Dilated ascending aorta (41 mm)  with Aortic atherosclerosis.  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   TRANSTHORACIC ECHOCARDIOGRAM  06/2021   (In setting of A. fib)EF 60 to 65%.  Mild LAE. Mildly elevated RAP.     Current Outpatient Medications  Medication Sig Dispense Refill   apixaban (ELIQUIS) 5 MG TABS tablet Take 1 tablet (5 mg total) by mouth 2 (two) times daily. 180 tablet 1   Cholecalciferol 50 MCG (2000 UT) TABS Take 2,000 Units by mouth daily.     diltiazem (CARDIZEM CD) 240 MG 24 hr capsule Take 1 capsule (240 mg total) by mouth daily. 30 capsule 11   diltiazem (CARDIZEM) 30 MG tablet Take 1 tablet (30 mg total) by mouth every 8 (eight) hours as needed (HR>95 or afib ,). 20 tablet 5   flecainide (TAMBOCOR) 50 MG tablet Take 1 tablet (50 mg total) by mouth 2 (two) times daily. 60 tablet 3   lisinopril (ZESTRIL) 20 MG tablet Take 1 tablet (20 mg total) by mouth at bedtime. 90 tablet 3   rosuvastatin (CRESTOR) 20 MG tablet Take 20 mg by mouth daily.     vitamin B-12 (CYANOCOBALAMIN) 100 MCG tablet Take 100 mcg by mouth daily.     No current facility-administered medications for this visit.    Allergies:   Patient has no known allergies.   Social History:  The patient  reports that he has never smoked. He has never used smokeless tobacco. He reports that he does not currently use alcohol. He reports that he does not currently use drugs.   Family History:  The patient's family history is not on file.    ROS:  Please  see the history of present illness.   Otherwise, review of systems is positive for none.   All other systems are reviewed and negative.    PHYSICAL EXAM: VS:  BP 132/76   Pulse 84   Ht 6' (1.829 m)   Wt 191 lb (86.6 kg)   SpO2 94%   BMI 25.90 kg/m  , BMI Body mass index is 25.9 kg/m. GEN: Well nourished, well developed, in no acute distress  HEENT: normal  Neck: no JVD, carotid bruits, or masses Cardiac: irregular; no murmurs, rubs, or gallops,no edema  Respiratory:  clear to  auscultation bilaterally, normal work of breathing GI: soft, nontender, nondistended, + BS MS: no deformity or atrophy  Skin: warm and dry Neuro:  Strength and sensation are intact Psych: euthymic mood, full affect  EKG:  EKG is not ordered today. Personal review of the ekg ordered 09/04/22 shows AF, rate 88  Recent Labs: 09/04/2022: BUN 15; Creatinine, Ser 1.02; Hemoglobin 16.0; Platelets 188; Potassium 3.7; Sodium 137    Lipid Panel  No results found for: "CHOL", "TRIG", "HDL", "CHOLHDL", "VLDL", "LDLCALC", "LDLDIRECT"   Wt Readings from Last 3 Encounters:  09/14/22 191 lb (86.6 kg)  09/04/22 191 lb (86.6 kg)  08/15/22 189 lb 12.8 oz (86.1 kg)      Other studies Reviewed: Additional studies/ records that were reviewed today include: TTE 07/01/21  Review of the above records today demonstrates:   1. Left ventricular ejection fraction, by estimation, is 60 to 65%. The  left ventricle has normal function. The left ventricle has no regional  wall motion abnormalities. Left ventricular diastolic parameters are  indeterminate.   2. Right ventricular systolic function is normal. The right ventricular  size is normal. Tricuspid regurgitation signal is inadequate for assessing  PA pressure.   3. Left atrial size was mildly dilated.   4. The mitral valve is normal in structure. No evidence of mitral valve  regurgitation.   5. The aortic valve is normal in structure. Aortic valve regurgitation is  mild. mild focal calcification.   6. The inferior vena cava is dilated in size with >50% respiratory  variability, suggesting right atrial pressure of 8 mmHg.     ASSESSMENT AND PLAN:  1.  Persistent atrial fibrillation: Currently on Eliquis, diltiazem, doses above.  CHA2DS2-VASc of 3.  He is in atrial fibrillation today.  He has palpitations but not much weakness or fatigue.  He has a cardioversion scheduled upcoming.  He prefer a rhythm control strategy.  We discussed medication options  versus ablation.  At this point, he would prefer to avoid procedures if possible.  He does have coronary artery disease noted on CTA, but no obstructive disease.  [name] start flecainide 50 mg twice daily.  [name] Cantin have him follow-up after his cardioversion for an EKG and to ensure he remains in rhythm.  2.  Second hypercoagulable state: Currently on Eliquis for atrial fibrillation as above  3.  Hypertension: Mildly elevated but usually well-controlled.  4.  Coronary atherosclerosis: Noted on prior CT scan.  Plan per primary cardiology.    Current medicines are reviewed at length with the patient today.   The patient does not have concerns regarding his medicines.  The following changes were made today: Start flecainide  Labs/ tests ordered today include:  No orders of the defined types were placed in this encounter.    Disposition:   FU with Braxden Lovering 3 months  Signed, Hughie Melroy Meredith Leeds, MD  09/14/2022 9:17 AM  [place] [address] [phone] (office) [phone] (fax)

## 2022-09-14 NOTE — Patient Instructions (Addendum)
Medication Instructions:  Your physician has recommended you make the following change in your medication: START Flecainide 50 mg twice a day  *If you need a refill on your cardiac medications before your next appointment, please call your pharmacy*   Lab Work: None ordered   Testing/Procedures: None ordered   Follow-Up: At [facility], you and your health needs are our priority.  As part of our continuing mission to provide you with exceptional heart care, we have created designated Provider Care Teams.  These Care Teams include your primary Cardiologist (physician) and Advanced Practice Providers (APPs -  Physician Assistants and Nurse Practitioners) who all work together to provide you with the care you need, when you need it.   Your next appointment:   2 week(s) after your cardioversion on 09/25/22  The format for your next appointment:   In Person  Provider:   You may see AFib clinic or one of the following Advanced Practice Providers on your designated Care Team:   Tommye Standard, [place] Legrand Como "Jonni Sanger" Chalmers Cater, [place]    Thank you for choosing [facility]!!   Trinidad Curet, RN [phone]  Other Instructions  Flecainide Tablets What is this medication? FLECAINIDE (FLEK a nide) prevents and treats a fast or irregular heartbeat (arrhythmia). It is often used to treat a type of arrhythmia known as AFib (atrial fibrillation). It works by slowing down overactive electric signals in the heart, which stabilizes your heart rhythm. It belongs to a group of medications called antiarrhythmics. This medicine may be used for other purposes; ask your health care provider or pharmacist if you have questions. COMMON BRAND NAME(S): Tambocor What should I tell my care team before I take this medication? They need to know if you have any of these conditions: High or low levels of potassium in the blood Heart disease including heart rhythm and heart rate problems Kidney  disease Liver disease Recent heart attack An unusual or allergic reaction to flecainide, other medications, foods, dyes, or preservatives Pregnant or trying to get pregnant Breastfeeding How should I use this medication? Take this medication by mouth with a glass of water. Take it as directed on the prescription label at the same time every day. You can take it with or without food. If it upsets your stomach, take it with food. Do not take your medication more often than directed. Do not stop taking this medication suddenly. This may cause serious, heart-related side effects. If your care team wants you to stop the medication, the dose may be slowly lowered over time to avoid any side effects. Talk to your care team about the use of this medication in children. While it may be prescribed for children as young as 1 year for selected conditions, precautions do apply. Overdosage: If you think you have taken too much of this medicine contact a poison control center or emergency room at once. NOTE: This medicine is only for you. Do not share this medicine with others. What if I miss a dose? If you miss a dose, take it as soon as you can. If it is almost time for your next dose, take only that dose. Do not take double or extra doses. What may interact with this medication? Do not take this medication with any of the following: Amoxapine Arsenic trioxide Certain antibiotics, such as clarithromycin, erythromycin, gatifloxacin, gemifloxacin, levofloxacin, moxifloxacin, sparfloxacin, or troleandomycin Certain antidepressants, called tricyclic antidepressants such as amitriptyline, imipramine, or nortriptyline Certain medications for irregular heartbeat, such as disopyramide, encainide,  moricizine, procainamide, propafenone, and quinidine Cisapride Delavirdine Droperidol Haloperidol [place] Imatinib Levomethadyl Maprotiline Medications for malaria, such as chloroquine and  halofantrine Pentamidine Phenothiazines, such as chlorpromazine, mesoridazine, prochlorperazine, thioridazine Pimozide Quinine Ranolazine Ritonavir Sertindole This medication may also interact with the following: Cimetidine Dofetilide Medications for angina or blood pressure Medications for irregular heartbeat, such as amiodarone and digoxin Ziprasidone This list may not describe all possible interactions. Give your health care provider a list of all the medicines, herbs, non-prescription drugs, or dietary supplements you use. Also tell them if you smoke, drink alcohol, or use illegal drugs. Some items may interact with your medicine. What should I watch for while using this medication? Visit your care team for regular checks on your progress. Because your condition and the use of this medication carries some risk, it is a good idea to carry an identification card, necklace, or bracelet with details of your condition, medications, and care team. Check your blood pressure and pulse rate as directed. Know what your blood pressure and pulse rate should be and when tod contact your care team. Your care team may schedule regular blood tests and electrocardiograms to check your progress. This medication may affect your coordination, reaction time, or judgment. Do not drive or operate machinery until you know how this medication affects you. Sit up or stand slowly to reduce the risk of dizzy or fainting spells. Drinking alcohol with this medication can increase the risk of these side effects. What side effects may I notice from receiving this medication? Side effects that you should report to your care team as soon as possible: Allergic reactions--skin rash, itching, hives, swelling of the face, lips, tongue, or throat Heart failure--shortness of breath, swelling of the ankles, feet, or hands, sudden weight gain, unusual weakness or fatigue Heart rhythm changes--fast or irregular heartbeat,  dizziness, feeling faint or lightheaded, chest pain, trouble breathing Liver injury--right upper belly pain, loss of appetite, nausea, light-colored stool, dark yellow or brown urine, yellowing skin or eyes, unusual weakness or fatigue Side effects that usually do not require medical attention (report to your care team if they continue or are bothersome): Blurry vision Constipation Dizziness Fatigue Headache Nausea Tremors or shaking This list may not describe all possible side effects. Call your doctor for medical advice about side effects. You may report side effects to FDA at 1-800-FDA-1088. Where should I keep my medication? Keep out of the reach of children and pets. Store at room temperature between 15 and 30 degrees C (59 and 86 degrees F). Protect from light. Keep container tightly closed. Throw away any unused medication after the expiration date. NOTE: This sheet is a summary. It may not cover all possible information. If you have questions about this medicine, talk to your doctor, pharmacist, or health care provider.  2023 Elsevier/Gold Standard (2004-09-29 00:00:00)

## 2022-09-20 ENCOUNTER — Ambulatory Visit (HOSPITAL_COMMUNITY): Payer: Medicare PPO | Admitting: Physician Assistant

## 2022-09-21 ENCOUNTER — Ambulatory Visit (HOSPITAL_COMMUNITY)
Admission: RE | Admit: 2022-09-21 | Discharge: 2022-09-21 | Disposition: A | Discharge status: Home / Self Care | Payer: Medicare PPO | Source: Ambulatory Visit | Attending: Physician Assistant | Admitting: Physician Assistant

## 2022-09-21 DIAGNOSIS — I4819 Other persistent atrial fibrillation: Secondary | ICD-10-CM | POA: Insufficient documentation

## 2022-09-21 DIAGNOSIS — I4891 Unspecified atrial fibrillation: Secondary | ICD-10-CM

## 2022-09-21 LAB — BASIC METABOLIC PANEL
Anion gap: 10 (ref 5–15)
BUN: 20 mg/dL (ref 8–23)
CO2: 22 mmol/L (ref 22–32)
Calcium: 9 mg/dL (ref 8.9–10.3)
Chloride: 105 mmol/L (ref 98–111)
Creatinine, Ser: 1.07 mg/dL (ref 0.61–1.24)
GFR, Estimated: >60 mL/min (ref >60)
Glucose, Bld: 96 mg/dL (ref 70–99)
Potassium: 4.1 mmol/L (ref 3.5–5.1)
Sodium: 137 mmol/L (ref 135–145)

## 2022-09-21 LAB — CBC
HCT: 46.9 % (ref 39.0–52.0)
Hemoglobin: 15.7 g/dL (ref 13.0–17.0)
MCH: 31.4 pg (ref 26.0–34.0)
MCHC: 33.5 g/dL (ref 30.0–36.0)
MCV: 93.8 fL (ref 80.0–100.0)
Platelets: 182 10*3/uL (ref 150–400)
RBC: 5 MIL/uL (ref 4.22–5.81)
RDW: 13.3 % (ref 11.5–15.5)
WBC: 5.2 10*3/uL (ref 4.0–10.5)
nRBC: 0 % (ref 0.0–0.2)

## 2022-09-25 ENCOUNTER — Ambulatory Visit (HOSPITAL_COMMUNITY): Payer: Medicare PPO | Admitting: Anesthesiology

## 2022-09-25 ENCOUNTER — Ambulatory Visit (HOSPITAL_BASED_OUTPATIENT_CLINIC_OR_DEPARTMENT_OTHER): Payer: Medicare PPO | Admitting: Anesthesiology

## 2022-09-25 ENCOUNTER — Ambulatory Visit (HOSPITAL_COMMUNITY)
Admission: RE | Admit: 2022-09-25 | Discharge: 2022-09-25 | Disposition: A | Discharge status: Home / Self Care | Payer: Medicare PPO | Attending: Cardiology | Admitting: Cardiology

## 2022-09-25 ENCOUNTER — Encounter (HOSPITAL_COMMUNITY)
Admission: RE | Disposition: A | Discharge status: Home / Self Care | Payer: Self-pay | Source: Home / Self Care | Attending: Cardiology

## 2022-09-25 ENCOUNTER — Other Ambulatory Visit: Payer: Self-pay

## 2022-09-25 ENCOUNTER — Encounter (HOSPITAL_COMMUNITY): Payer: Self-pay | Admitting: Cardiology

## 2022-09-25 DIAGNOSIS — I4891 Unspecified atrial fibrillation: Secondary | ICD-10-CM

## 2022-09-25 DIAGNOSIS — Z7901 Long term (current) use of anticoagulants: Secondary | ICD-10-CM | POA: Diagnosis not present

## 2022-09-25 DIAGNOSIS — I4819 Other persistent atrial fibrillation: Secondary | ICD-10-CM | POA: Insufficient documentation

## 2022-09-25 DIAGNOSIS — E785 Hyperlipidemia, unspecified: Secondary | ICD-10-CM | POA: Diagnosis not present

## 2022-09-25 DIAGNOSIS — I10 Essential (primary) hypertension: Secondary | ICD-10-CM

## 2022-09-25 DIAGNOSIS — D6859 Other primary thrombophilia: Secondary | ICD-10-CM | POA: Insufficient documentation

## 2022-09-25 DIAGNOSIS — K219 Gastro-esophageal reflux disease without esophagitis: Secondary | ICD-10-CM | POA: Diagnosis not present

## 2022-09-25 DIAGNOSIS — N400 Enlarged prostate without lower urinary tract symptoms: Secondary | ICD-10-CM | POA: Insufficient documentation

## 2022-09-25 DIAGNOSIS — Z79899 Other long term (current) drug therapy: Secondary | ICD-10-CM | POA: Insufficient documentation

## 2022-09-25 DIAGNOSIS — I2510 Atherosclerotic heart disease of native coronary artery without angina pectoris: Secondary | ICD-10-CM | POA: Insufficient documentation

## 2022-09-25 HISTORY — PX: CARDIOVERSION: SHX1299

## 2022-09-25 SURGERY — CARDIOVERSION
Anesthesia: Monitor Anesthesia Care

## 2022-09-25 MED ORDER — PROPOFOL 10 MG/ML IV BOLUS
INTRAVENOUS | Status: DC | PRN
  Administered 2022-09-25: 40 mg via INTRAVENOUS
  Administered 2022-09-25: 50 mg via INTRAVENOUS
  Administered 2022-09-25: 30 mg via INTRAVENOUS

## 2022-09-25 MED ORDER — SODIUM CHLORIDE 0.9 % IV SOLN: INTRAVENOUS | Status: DC

## 2022-09-25 NOTE — Anesthesia Postprocedure Evaluation (Signed)
Anesthesia Post Note  Patient: Albert [name]  Procedure(s) Performed: CARDIOVERSION     Patient location during evaluation: Endoscopy Anesthesia Type: MAC Level of consciousness: sedated and awake Pain management: pain level controlled Vital Signs Assessment: post-procedure vital signs reviewed and stable Respiratory status: spontaneous breathing Cardiovascular status: stable Postop Assessment: no apparent nausea or vomiting Anesthetic complications: no  No notable events documented.  Last Vitals:  Vitals:   09/25/22 0854 09/25/22 0855  BP: 126/79   Pulse: 76 76  Resp: 20 20  Temp: 36.9 C   SpO2: 94% 95%    Last Pain:  Vitals:   09/25/22 0854  TempSrc: Temporal  PainSc: 0-No pain   Pain Goal:                   Huston Foley

## 2022-09-25 NOTE — Transfer of Care (Signed)
Immediate Anesthesia Transfer of Care Note  Patient: Albert [name]  Procedure(s) Performed: CARDIOVERSION  Patient Location: Cath Lab  Anesthesia Type:MAC  Level of Consciousness: drowsy and patient cooperative  Airway & Oxygen Therapy: Patient Spontanous Breathing  Post-op Assessment: Report given to RN and Post -op Vital signs reviewed and stable  Post vital signs: Reviewed and stable  Last Vitals:  Vitals Value Taken Time  BP 126/79 09/25/22 0854  Temp 36.9 C 09/25/22 0854  Pulse 76 09/25/22 0855  Resp 20 09/25/22 0855  SpO2 95 % 09/25/22 0855    Last Pain:  Vitals:   09/25/22 0854  TempSrc: Temporal  PainSc: 0-No pain         Complications: No notable events documented.

## 2022-09-25 NOTE — CV Procedure (Signed)
Procedure:   DCCV  Indication:  Symptomatic atrial fibrillation  Procedure Note:  The patient signed informed consent.  They have had had therapeutic anticoagulation with Eliquis greater than 3 weeks.  Anesthesia was administered by Dr. Jillyn Hidden.  Adequate airway was maintained throughout and vital followed per protocol.  They were cardioverted x 1 with 200J of biphasic synchronized energy.  They converted to NSR with HR in 70s.  There were no apparent complications.  The patient had normal neuro status and respiratory status post procedure with vitals stable as recorded elsewhere.    Follow up:  They will continue on current medical therapy and follow up with cardiology as scheduled.  Oswaldo Milian, MD 09/25/2022 8:51 AM

## 2022-09-25 NOTE — Anesthesia Preprocedure Evaluation (Addendum)
Anesthesia Evaluation  Patient identified by MRN, date of birth, ID band Patient awake    Reviewed: Allergy & Precautions, NPO status , Patient's Chart, lab work & pertinent test results  Airway Mallampati: I       Dental  (+) Teeth Intact, Poor Dentition   Pulmonary neg pulmonary ROS   Pulmonary exam normal        Cardiovascular hypertension, Pt. on medications + dysrhythmias Atrial Fibrillation  Rhythm:Irregular Rate:Tachycardia     Neuro/Psych negative neurological ROS  negative psych ROS   GI/Hepatic   Endo/Other  negative endocrine ROS    Renal/GU negative Renal ROS  negative genitourinary   Musculoskeletal   Abdominal Normal abdominal exam  (+)   Peds  Hematology negative hematology ROS (+)   Anesthesia Other Findings    1. Left ventricular ejection fraction, by estimation, is 60 to 65%. The  left ventricle has normal function. The left ventricle has no regional  wall motion abnormalities. Left ventricular diastolic parameters are  indeterminate.   2. Right ventricular systolic function is normal. The right ventricular  size is normal. Tricuspid regurgitation signal is inadequate for assessing  PA pressure.   3. Left atrial size was mildly dilated.   4. The mitral valve is normal in structure. No evidence of mitral valve  regurgitation.   5. The aortic valve is normal in structure. Aortic valve regurgitation is  mild. mild focal calcification.   6. The inferior vena cava is dilated in size with >50% respiratory  variability, suggesting right atrial pressure of 8 mmHg.     Reproductive/Obstetrics                             Anesthesia Physical Anesthesia Plan  ASA: 2  Anesthesia Plan: MAC   Post-op Pain Management:    Induction:   PONV Risk Score and Plan: Propofol infusion and TIVA  Airway Management Planned:   Additional Equipment: None  Intra-op Plan:    Post-operative Plan:   Informed Consent: I have reviewed the patients History and Physical, chart, labs and discussed the procedure including the risks, benefits and alternatives for the proposed anesthesia with the patient or authorized representative who has indicated his/her understanding and acceptance.       Plan Discussed with: CRNA  Anesthesia Plan Comments:        Anesthesia Quick Evaluation

## 2022-09-25 NOTE — Interval H&P Note (Signed)
History and Physical Interval Note:  09/25/2022 8:41 AM  Albert [name]  has presented today for surgery, with the diagnosis of AFIB.  The various methods of treatment have been discussed with the patient and family. After consideration of risks, benefits and other options for treatment, the patient has consented to  Procedure(s): CARDIOVERSION (N/A) as a surgical intervention.  The patient's history has been reviewed, patient examined, no change in status, stable for surgery.  I have reviewed the patient's chart and labs.  Questions were answered to the patient's satisfaction.     Donato Heinz

## 2022-09-25 NOTE — Discharge Instructions (Signed)

## 2022-09-26 ENCOUNTER — Encounter (HOSPITAL_COMMUNITY): Payer: Self-pay | Admitting: Cardiology

## 2022-10-08 NOTE — Progress Notes (Unsigned)
Cardiology Office Note Date:  10/10/2022  Patient ID:  Albert, [name] June 10, 1951, MRN [id number] PCP:  Jonathon Bellows, PA-C  Cardiologist:  Dr. Ellyn Hack Electrophysiologist: Dr. Curt Bears   Chief Complaint:  post DCCV  History of Present Illness: Albert [name] is a 72 y.o. male with history of HTN, HLD, AFib.  He saw the AFib clinic 09/04/22, with recurrent AFib, referred to Dr. Curt Bears.  Saw Dr. Curt Bears 09/14/22, rates generally controlled, aware ofhis Afib w/palpitations.  Discussed management strategies, preferred rhythm control, though wanted to try and avoid procedures. Started on flecainide '50mg'$  BID and plan DCCV  DCCV 09/25/22, successful   TODAY He is accompanied by his wife today He feels quite well He is very active, goes to the [facility] 5days/week, cycling/treadmill/elliptical and some weights, feels great when exercising, no CP, palpitations or cardiac awareness He can tell when he is in Afib with awareness of the irregularity in his heart beat, and feels a bit off. No other attributable symptoms to the AFib No fainting No bleeding or signs of bleeding  He would like to get back on track to get the CT done that Dr. Ellyn Hack wanted him to do, now back in SR (seems to f/u on some ca++ noted on a CT chest done previously)  He notices that when exercising his HR in normal rhythm doesn't spike nearly as high as it did when in AFib (140's-150's), in SR perhaps at peak exercise 110's, and feels very well  AFib/AAD hx Diagnosed 2022 Flecainide started Feb 2024  Past Medical History:  Diagnosis Date   Allergic rhinitis    Aortic atherosclerosis ([place]) 06/2021   Noted on CT scan   BPH (benign prostatic hyperplasia)    Coronary artery calcification seen on CAT scan 06/2021   GERD (gastroesophageal reflux disease)    Hyperlipidemia    Hypertension    Persistent atrial fibrillation ([place]) 06/30/2021   CHA2DS2-VASc score 3-on Eliquis and diltiazem    Past  Surgical History:  Procedure Laterality Date   CARDIOVERSION N/A 07/27/2021   Procedure: CARDIOVERSION;  Surgeon: Pixie Casino, MD;  Location: [place];  Service: Cardiovascular;  Laterality: N/A;   CARDIOVERSION N/A 09/25/2022   Procedure: CARDIOVERSION;  Surgeon: Donato Heinz, MD;  Location: [facility] ENDOSCOPY;  Service: Cardiovascular;  Laterality: N/A;   CT CTA CORONARY W/CA SCORE W/CM &/OR WO/CM  08/31/2021   Coronary calcium score of 176.  Normal coronary origin with right dominance. Minimal CAD in the distal LM and proximal LAD/Lcx as outlined above.  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   TRANSTHORACIC ECHOCARDIOGRAM  06/2021   (In setting of A. fib)EF 60 to 65%.  Mild LAE. Mildly elevated RAP.    Current Outpatient Medications  Medication Sig Dispense Refill   apixaban (ELIQUIS) 5 MG TABS tablet Take 1 tablet (5 mg total) by mouth 2 (two) times daily. 180 tablet 1   Cholecalciferol 50 MCG (2000 UT) TABS Take 2,000 Units by mouth daily.     diltiazem (CARDIZEM CD) 240 MG 24 hr capsule TAKE ONE CAPSULE BY MOUTH DAILY 90 capsule 0   diltiazem (CARDIZEM) 30 MG tablet Take 1 tablet (30 mg total) by mouth every 8 (eight) hours as needed (HR>95 or afib ,). 20 tablet 5   flecainide (TAMBOCOR) 50 MG tablet Take 1 tablet (50 mg total) by mouth 2 (two) times daily. 60 tablet 3   lisinopril (ZESTRIL) 20 MG tablet Take  1 tablet (20 mg total) by mouth at bedtime. 90 tablet 3   rosuvastatin (CRESTOR) 20 MG tablet Take 20 mg by mouth daily.     vitamin B-12 (CYANOCOBALAMIN) 100 MCG tablet Take 100 mcg by mouth daily.     No current facility-administered medications for this visit.    Allergies:   Patient has no known allergies.   Social History:  The patient  reports that he has never smoked. He has never used smokeless tobacco. He reports that he does not currently use alcohol. He reports that he does not currently  use drugs.   Family History:  The patient's family history is not on file.  ROS:  Please see the history of present illness.    All other systems are reviewed and otherwise negative.   PHYSICAL EXAM:  VS:  BP 138/66   Pulse 81   Ht 6' (1.829 m)   Wt 190 lb (86.2 kg)   SpO2 96%   BMI 25.77 kg/m  BMI: Body mass index is 25.77 kg/m. Well nourished, well developed, in no acute distress HEENT: normocephalic, atraumatic Neck: no JVD, carotid bruits or masses Cardiac:   RRR; no significant murmurs, no rubs, or gallops Lungs:   CTA b/l, no wheezing, rhonchi or rales Abd: soft, nontender MS: no deformity or atrophy Ext:  no edema Skin: warm and dry, no rash Neuro:  No gross deficits appreciated Psych: euthymic mood, full affect    EKG:  Done today and reviewed by myself shows  SR 81bpm, stable intervals  08/31/21: Coronary CT IMPRESSION: 1. Coronary calcium score of 176. This was 24 percentile for age-, sex, and race-matched controls. 2. Normal coronary origin with right dominance. 3. Minimal CAD in the distal LM and proximal LAD/Lcx as outlined above. 4. Dilated ascending aorta (41 mm). 5. Aortic atherosclerosis. 6. 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 CAD-RADS 1: Minimal non-obstructive CAD (0-24%). Consider non-atherosclerotic causes of chest pain. Consider preventive therapy and risk factor modification.  07/01/21: TTE  1. Left ventricular ejection fraction, by estimation, is 60 to 65%. The  left ventricle has normal function. The left ventricle has no regional  wall motion abnormalities. Left ventricular diastolic parameters are  indeterminate.   2. Right ventricular systolic function is normal. The right ventricular  size is normal. Tricuspid regurgitation signal is inadequate for assessing  PA pressure.   3. Left atrial size was mildly dilated.   4. The mitral valve is normal in structure. No evidence of mitral valve  regurgitation.   5. The aortic valve is  normal in structure. Aortic valve regurgitation is  mild. mild focal calcification.   6. The inferior vena cava is dilated in size with >50% respiratory  variability, suggesting right atrial pressure of 8 mmHg.   Conclusion(s)/Recommendation(s): Otherwise normal echocardiogram, with  minor abnormalities described in the report.    Recent Labs: 09/21/2022: BUN 20; Creatinine, Ser 1.07; Hemoglobin 15.7; Platelets 182; Potassium 4.1; Sodium 137  No results found for requested labs within last 365 days.   Estimated Creatinine Clearance: 71.5 mL/min (by C-G formula based on SCr of 1.07 mg/dL).   Wt Readings from Last 3 Encounters:  10/10/22 190 lb (86.2 kg)  09/25/22 185 lb (83.9 kg)  09/14/22 191 lb (86.6 kg)     Other studies reviewed: Additional studies/records reviewed today include: summarized above  ASSESSMENT AND PLAN:  Persistent AFib CHA2DS2Vasc is 3, on Eliquis, appropriately dosed Stable intervals on flecainide and diltiazem   Plan EST for his  flecainide management/monitoring D/w the patient   HTN Looks ok  Secondary hypercoagulable state  Disposition: F/u with Dr. Billy Fischer as instructed by them, my MA will send a not to his team to re-visit his coronary CT he wanted done.  EP in 3 mo, sooner if needed  Current medicines are reviewed at length with the patient today.  The patient did not have any concerns regarding medicines.  Venetia Night, PA-C 10/10/2022 10:19 AM     [facility] [place] [address] [phone] (office)  [phone] (fax)

## 2022-10-09 ENCOUNTER — Other Ambulatory Visit (HOSPITAL_BASED_OUTPATIENT_CLINIC_OR_DEPARTMENT_OTHER): Payer: Self-pay | Admitting: Internal Medicine

## 2022-10-10 ENCOUNTER — Encounter: Payer: Self-pay | Admitting: Physician Assistant

## 2022-10-10 ENCOUNTER — Ambulatory Visit: Payer: Medicare PPO | Attending: Physician Assistant | Admitting: Physician Assistant

## 2022-10-10 VITALS — BP 138/66 | HR 81 | Ht 72.0 in | Wt 190.0 lb

## 2022-10-10 DIAGNOSIS — I4819 Other persistent atrial fibrillation: Secondary | ICD-10-CM | POA: Diagnosis not present

## 2022-10-10 DIAGNOSIS — D6869 Other thrombophilia: Secondary | ICD-10-CM

## 2022-10-10 DIAGNOSIS — Z79899 Other long term (current) drug therapy: Secondary | ICD-10-CM

## 2022-10-10 DIAGNOSIS — Z5181 Encounter for therapeutic drug level monitoring: Secondary | ICD-10-CM

## 2022-10-10 DIAGNOSIS — I10 Essential (primary) hypertension: Secondary | ICD-10-CM

## 2022-10-10 NOTE — Patient Instructions (Addendum)
Medication Instructions:   Your physician recommends that you continue on your current medications as directed. Please refer to the Current Medication list given to you today.  *If you need a refill on your cardiac medications before your next appointment, please call your pharmacy*   Lab Work: [place]   If you have labs (blood work) drawn today and your tests are completely normal, you will receive your results only by: [place] (if you have MyChart) OR A paper copy in the mail If you have any lab test that is abnormal or we need to change your treatment, we will call you to review the results.   Testing/Procedures: Your physician has requested that you have an exercise tolerance test. For further information please visit [email or web address]. Please also follow instruction sheet, as given.    Follow-Up: At [facility], you and your health needs are our priority.  As part of our continuing mission to provide you with exceptional heart care, we have created designated Provider Care Teams.  These Care Teams include your primary Cardiologist (physician) and Advanced Practice Providers (APPs -  Physician Assistants and Nurse Practitioners) who all work together to provide you with the care you need, when you need it.  We recommend signing up for the patient portal called "MyChart".  Sign up information is provided on this After Visit Summary.  MyChart is used to connect with patients for Virtual Visits (Telemedicine).  Patients are able to view lab/test results, encounter notes, upcoming appointments, etc.  Non-urgent messages can be sent to your provider as well.   To learn more about what you can do with MyChart, go to [email or web address].    Your next appointment:   3 month(s)   Provider:   Tommye Standard, PA-C / CAMNITZ    Other Instructions

## 2022-10-17 ENCOUNTER — Ambulatory Visit: Payer: Medicare PPO

## 2022-10-24 ENCOUNTER — Ambulatory Visit: Payer: Medicare PPO | Attending: Cardiology

## 2022-10-24 DIAGNOSIS — Z79899 Other long term (current) drug therapy: Secondary | ICD-10-CM

## 2022-10-24 DIAGNOSIS — Z5181 Encounter for therapeutic drug level monitoring: Secondary | ICD-10-CM

## 2022-10-24 LAB — EXERCISE TOLERANCE TEST
Angina Index: 0
Duke Treadmill Score: 10
Estimated workload: 11.7
Exercise duration (min): 10 min
Exercise duration (sec): 0 s
MPHR: 151 {beats}/min
Peak HR: 141 {beats}/min
Percent HR: 93 %
RPE: 17
Rest HR: 79 {beats}/min
ST Depression (mm): 0 mm

## 2022-11-05 ENCOUNTER — Other Ambulatory Visit: Payer: Self-pay | Admitting: Cardiology

## 2022-11-09 ENCOUNTER — Telehealth: Payer: Self-pay | Admitting: Cardiology

## 2022-11-09 NOTE — Telephone Encounter (Signed)
Pt's medication was sent to pt's pharmacy already. Confirmation received. 11/07/22

## 2022-11-09 NOTE — Telephone Encounter (Signed)
*  STAT* If patient is at the pharmacy, call can be transferred to refill team.   1. Which medications need to be refilled? (please list name of each medication and dose if known) lisinopril (ZESTRIL) 20 MG tablet   2. Which pharmacy/location (including street and city if local pharmacy) is medication to be sent to?  [facility] [id number] - [place], [place] - [address]    3. Do they need a 30 day or 90 day supply? 90

## 2023-01-03 ENCOUNTER — Other Ambulatory Visit (HOSPITAL_BASED_OUTPATIENT_CLINIC_OR_DEPARTMENT_OTHER): Payer: Self-pay | Admitting: Internal Medicine

## 2023-01-04 ENCOUNTER — Other Ambulatory Visit: Payer: Self-pay | Admitting: Cardiology

## 2023-01-04 NOTE — Telephone Encounter (Signed)
Prescription refill request for Eliquis received. Indication:afib Last office visit:3/24 Scr:1.07  2/24 Age: 72 Weight:86.2  kg  Prescription refilled

## 2023-01-07 ENCOUNTER — Other Ambulatory Visit: Payer: Self-pay

## 2023-01-07 MED ORDER — FLECAINIDE ACETATE 50 MG PO TABS: 50.0000 mg | ORAL_TABLET | Freq: Two times a day (BID) | ORAL | 3 refills | Status: DC

## 2023-01-07 NOTE — Telephone Encounter (Signed)
Pt's medication was sent to pt's pharmacy as requested. Confirmation received.  °

## 2023-01-12 NOTE — Progress Notes (Unsigned)
Cardiology Office Note Date:  01/12/2023  Patient ID:  Albert [name] 29-May-1951, MRN [id number] PCP:  Bailey Mech, PA-C  Cardiologist:  Dr. Herbie Baltimore Electrophysiologist: Dr. Elberta Fortis   Chief Complaint:  *** 3 mo  History of Present Illness: Albert [name] is a 72 y.o. male with history of HTN, HLD, AFib.  He saw the AFib clinic 09/04/22, with recurrent AFib, referred to Dr. Elberta Fortis.  Saw Dr. Elberta Fortis 09/14/22, rates generally controlled, aware ofhis Afib w/palpitations.  Discussed management strategies, preferred rhythm control, though wanted to try and avoid procedures. Started on flecainide 50mg  BID and plan DCCV  DCCV 09/25/22, successful   TODAY He is accompanied by his wife today He feels quite well He is very active, goes to the [facility] 5days/week, cycling/treadmill/elliptical and some weights, feels great when exercising, no CP, palpitations or cardiac awareness He can tell when he is in Afib with awareness of the irregularity in his heart beat, and feels a bit off. No other attributable symptoms to the AFib No fainting No bleeding or signs of bleeding He would like to get back on track to get the CT done that Dr. Herbie Baltimore wanted him to do, now back in SR (seems to f/u on some ca++ noted on a CT chest done previously) He notices that when exercising his HR in normal rhythm doesn't spike nearly as high as it did when in AFib (140's-150's), in SR perhaps at peak exercise 110's, and feels very well  Planned for EST for his flecainide management  *** flecainide EKG, nodal blocker *** eliquis, bleeding, dose, labs  AFib/AAD hx Diagnosed 2022 Flecainide started Feb 2024  Past Medical History:  Diagnosis Date   Allergic rhinitis    Aortic atherosclerosis ([facility]) 06/2021   Noted on CT scan   BPH (benign prostatic hyperplasia)    Coronary artery calcification seen on CAT scan 06/2021   GERD (gastroesophageal reflux disease)    Hyperlipidemia     Hypertension    Persistent atrial fibrillation ([facility]) 06/30/2021   CHA2DS2-VASc score 3-on Eliquis and diltiazem    Past Surgical History:  Procedure Laterality Date   CARDIOVERSION N/A 07/27/2021   Procedure: CARDIOVERSION;  Surgeon: [name], MD;  Location: [facility] ENDOSCOPY;  Service: Cardiovascular;  Laterality: N/A;   CARDIOVERSION N/A 09/25/2022   Procedure: CARDIOVERSION;  Surgeon: Little Ishikawa, MD;  Location: [facility] ENDOSCOPY;  Service: Cardiovascular;  Laterality: N/A;   CT CTA CORONARY W/CA SCORE W/CM &/OR WO/CM  08/31/2021   Coronary calcium score of 176.  Normal coronary origin with right dominance. Minimal CAD in the distal LM and proximal LAD/Lcx as outlined above.  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   TRANSTHORACIC ECHOCARDIOGRAM  06/2021   (In setting of A. fib)EF 60 to 65%.  Mild LAE. Mildly elevated RAP.    Current Outpatient Medications  Medication Sig Dispense Refill   Cholecalciferol 50 MCG (2000 UT) TABS Take 2,000 Units by mouth daily.     diltiazem (CARDIZEM CD) 240 MG 24 hr capsule TAKE 1 CAPSULE BY MOUTH DAILY 90 capsule 0   diltiazem (CARDIZEM) 30 MG tablet Take 1 tablet (30 mg total) by mouth every 8 (eight) hours as needed (HR>95 or afib ,). 20 tablet 5   ELIQUIS 5 MG TABS tablet TAKE 1 TABLET BY MOUTH TWICE A DAY 180 tablet 1   flecainide (TAMBOCOR) 50 MG tablet Take 1 tablet (50 mg total) by mouth 2 (two)  times daily. 180 tablet 3   lisinopril (ZESTRIL) 20 MG tablet TAKE ONE TABLET BY MOUTH AT BEDTIME 90 tablet 3   rosuvastatin (CRESTOR) 20 MG tablet Take 20 mg by mouth daily.     vitamin B-12 (CYANOCOBALAMIN) 100 MCG tablet Take 100 mcg by mouth daily.     No current facility-administered medications for this visit.    Allergies:   Patient has no known allergies.   Social History:  The patient  reports that he has never smoked. He has never used smokeless tobacco. He reports  that he does not currently use alcohol. He reports that he does not currently use drugs.   Family History:  The patient's family history is not on file.  ROS:  Please see the history of present illness.    All other systems are reviewed and otherwise negative.   PHYSICAL EXAM:  VS:  There were no vitals taken for this visit. BMI: There is no height or weight on file to calculate BMI. Well nourished, well developed, in no acute distress HEENT: normocephalic, atraumatic Neck: no JVD, carotid bruits or masses Cardiac:   RRR; no significant murmurs, no rubs, or gallops Lungs:   CTA b/l, no wheezing, rhonchi or rales Abd: soft, nontender MS: no deformity or atrophy Ext:  no edema Skin: warm and dry, no rash Neuro:  No gross deficits appreciated Psych: euthymic mood, full affect    EKG:  Done today and reviewed by myself shows  *** SR 81bpm, stable intervals   10/04/22: EST Patient exercised according to the BRUCE protocol for 10:15min achieving 11.7 METs   Target HR was achieved (141bpm; 93% MPHR)   No ST deviation was noted.   No QRS widening or significant ectopy during stress   No electrocardiographic evidence of ischemia  08/31/21: Coronary CT IMPRESSION: 1. Coronary calcium score of 176. This was 53 percentile for age-, sex, and race-matched controls. 2. Normal coronary origin with right dominance. 3. Minimal CAD in the distal LM and proximal LAD/Lcx as outlined above. 4. Dilated ascending aorta (41 mm). 5. Aortic atherosclerosis. 6. 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 CAD-RADS 1: Minimal non-obstructive CAD (0-24%). Consider non-atherosclerotic causes of chest pain. Consider preventive therapy and risk factor modification.  07/01/21: TTE  1. Left ventricular ejection fraction, by estimation, is 60 to 65%. The  left ventricle has normal function. The left ventricle has no regional  wall motion abnormalities. Left ventricular diastolic parameters are   indeterminate.   2. Right ventricular systolic function is normal. The right ventricular  size is normal. Tricuspid regurgitation signal is inadequate for assessing  PA pressure.   3. Left atrial size was mildly dilated.   4. The mitral valve is normal in structure. No evidence of mitral valve  regurgitation.   5. The aortic valve is normal in structure. Aortic valve regurgitation is  mild. mild focal calcification.   6. The inferior vena cava is dilated in size with >50% respiratory  variability, suggesting right atrial pressure of 8 mmHg.   Conclusion(s)/Recommendation(s): Otherwise normal echocardiogram, with  minor abnormalities described in the report.    Recent Labs: 09/21/2022: BUN 20; Creatinine, Ser 1.07; Hemoglobin 15.7; Platelets 182; Potassium 4.1; Sodium 137  No results found for requested labs within last 365 days.   CrCl cannot be calculated (Patient's most recent lab result is older than the maximum 21 days allowed.).   Wt Readings from Last 3 Encounters:  10/10/22 190 lb (86.2 kg)  09/25/22 185 lb (83.9  kg)  09/14/22 191 lb (86.6 kg)     Other studies reviewed: Additional studies/records reviewed today include: summarized above  ASSESSMENT AND PLAN:  Persistent AFib CHA2DS2Vasc is 3, on Eliquis, *** appropriately dosed *** Stable intervals on flecainide and diltiazem    HTN *** Looks ok  Secondary hypercoagulable state  Disposition: ***  Current medicines are reviewed at length with the patient today.  The patient did not have any concerns regarding medicines.  Albert Fredrickson, PA-C 01/12/2023 3:46 PM     [facility] [address] [place] [address] [phone] (office)  [phone] (fax)

## 2023-01-14 NOTE — Progress Notes (Signed)
Cardiology Office Note:  .   Date:  01/15/2023  ID:  Albert [name], DOB 1951-03-04, MRN [id number] PCP: Bailey Mech, PA-C  [facility] Providers Cardiologist:  Bryan Lemma, MD Electrophysiologist:  Will Jorja Loa, MD     History of Present Illness: Albert [name] is a 72 y.o. male with PMH of GERD, HTN, HLD, minimal CAD on CT, Persistent AF on eliquis who presents for follow up of AF.   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 Lives with significant other, Sonya. Has 5 Westies.   He was last seen in EP clinic 09/2022 and was stable on flecainide post DCCV.  EST was normal on flecainide.   Pt returns for follow up of AF. He has been tolerating exercise 5 days a week at the [facility].  He follows his HR on [facility] and has no episodes of AF.  No feelings of recurrent AF (felt tremulous before).  He has not had any difficulty with eliquis / no bleeding episodes. Reports that he is feeling well overall. Monitors BP at home with SBP range of 110 to 120's, occasional high 130's.   ROS: Denies chest pain, shortness of breath, palpitations, LE swelling, dizziness, lightheadedness. Otherwise negative unless in HPI.     EP Information / Studies Reviewed: .    Studies 08/2021 Coronary CT > coronary calcium score 176 / 57 percentile, minimal CAD in the distal LM, proximal LAD/Lcx, dilated ascending aorta, aortic atherosclerosis, mildly dilated pulmonary artery 09/2022 EKG > SR 81, LAD  09/2022 EST > no QRS widening, ST deviation or significant ectopy 01/15/23 EKG >  personally reviewed, SR 62, normal intervals  Arrhythmia / Device  AF diagnosed 2022, Persistent AF on Eliquis 5mg , cardizem 08/2022 > Pt elected rhythm control/avoid procedures. Started on flecainide with subsequent DCCV   AAD / Anticoagulation  08/2022 Flecainide  Eliquis 5mg  BID, appropriately dosed   Risk Assessment/Calculations:    CHA2DS2-VASc Score = 3   This indicates a 3.2% annual risk of  stroke. The patient's score is based upon: CHF History: 0 HTN History: 1 Diabetes History: 0 Stroke History: 0 Vascular Disease History: 1 Age Score: 1 Gender Score: 0            Physical Exam:   VS:  BP 124/78 (BP Location: Right Arm)   Pulse 72   Ht 6' (1.829 m)   Wt 185 lb 6.4 oz (84.1 kg)   SpO2 98%   BMI 25.14 kg/m    Wt Readings from Last 3 Encounters:  01/15/23 185 lb 6.4 oz (84.1 kg)  10/10/22 190 lb (86.2 kg)  09/25/22 185 lb (83.9 kg)    GEN: Well nourished, well developed in no acute distress NECK: No JVD; No carotid bruits CARDIAC: S1S2 RRR, no murmurs, rubs, gallops RESPIRATORY:  Clear to auscultation without rales, wheezing or rhonchi  ABDOMEN: Soft, non-tender, non-distended EXTREMITIES:  No edema; No deformity   ASSESSMENT AND PLAN: .    Persistent Atrial Fibrillation  CHA2DS2VASC 3(, s/p DCCV 09/25/22 -remains in NSR -stable intervals on flecainide -continue diltiazem -eliquis dosing reviewed, appropriately dosed / no bleeding issues   HTN -BP re-check 124/78, good control at home / reviewed pt's records on visit  -continue lisinopril  Aortic Atherosclerosis Seen on CT -follow up with Dr. Herbie Baltimore on CT Aorta (previously ordered, canceled with AF episode in Feb 2024) -lipid control per Dr. Herbie Baltimore   Secondary Hypercoagulable State  -continue eliquis, dosing reviewed / appropriate  Dispo: Follow up with EP APP in 4 months  Signed, Canary Brim, MSN, APRN, NP-C, AGACNP-BC [facility] - Electrophysiology  01/15/2023, 10:39 AM

## 2023-01-15 ENCOUNTER — Ambulatory Visit: Payer: Medicare PPO | Attending: Physician Assistant | Admitting: Pulmonary Disease

## 2023-01-15 ENCOUNTER — Encounter: Payer: Self-pay | Admitting: Physician Assistant

## 2023-01-15 VITALS — BP 124/78 | HR 72 | Ht 72.0 in | Wt 185.4 lb

## 2023-01-15 DIAGNOSIS — I4819 Other persistent atrial fibrillation: Secondary | ICD-10-CM

## 2023-01-15 DIAGNOSIS — Z5181 Encounter for therapeutic drug level monitoring: Secondary | ICD-10-CM | POA: Diagnosis not present

## 2023-01-15 DIAGNOSIS — I10 Essential (primary) hypertension: Secondary | ICD-10-CM

## 2023-01-15 DIAGNOSIS — Z79899 Other long term (current) drug therapy: Secondary | ICD-10-CM

## 2023-01-15 DIAGNOSIS — D6869 Other thrombophilia: Secondary | ICD-10-CM | POA: Diagnosis not present

## 2023-01-15 NOTE — Patient Instructions (Addendum)
Medication Instructions:    Your physician recommends that you continue on your current medications as directed. Please refer to the Current Medication list given to you today.    *If you need a refill on your cardiac medications before your next appointment, please call your pharmacy*   Lab Work:  NONE ORDERED  TODAY    If you have labs (blood work) drawn today and your tests are completely normal, you will receive your results only by: MyChart Message (if you have MyChart) OR A paper copy in the mail If you have any lab test that is abnormal or we need to change your treatment, we will call you to review the results.   Testing/Procedures: NONE ORDERED  TODAY      Follow-Up: At [facility], you and your health needs are our priority.  As part of our continuing mission to provide you with exceptional heart care, we have created designated Provider Care Teams.  These Care Teams include your primary Cardiologist (physician) and Advanced Practice Providers (APPs -  Physician Assistants and Nurse Practitioners) who all work together to provide you with the care you need, when you need it.  We recommend signing up for the patient portal called "MyChart".  Sign up information is provided on this After Visit Summary.  MyChart is used to connect with patients for Virtual Visits (Telemedicine).  Patients are able to view lab/test results, encounter notes, upcoming appointments, etc.  Non-urgent messages can be sent to your provider as well.   To learn more about what you can do with MyChart, go to [email or web address].    Your next appointment:    4 month(s)  Provider:    You may see Will Jorja Loa, MD or one of the following Advanced Practice Providers on your designated Care Team:   Francis Dowse, [place] Casimiro Needle "Mardelle Matte" Lanna Poche, [place]   Other Instructions

## 2023-04-02 ENCOUNTER — Other Ambulatory Visit: Payer: Self-pay | Admitting: *Deleted

## 2023-04-02 MED ORDER — DILTIAZEM HCL ER COATED BEADS 240 MG PO CP24: 240.0000 mg | ORAL_CAPSULE | Freq: Every day | ORAL | 2 refills | Status: DC

## 2023-05-16 ENCOUNTER — Ambulatory Visit: Payer: Medicare PPO | Admitting: Cardiology

## 2023-06-05 NOTE — Progress Notes (Unsigned)
  Electrophysiology Office Note:   Date:  06/06/2023  ID:  Cono Gebhard, DOB 06-Jul-1951, MRN [id number]  Primary Cardiologist: Bryan Lemma, MD Electrophysiologist: Will Jorja Loa, MD      History of Present Illness:   Albert [name] is a 72 y.o. male with h/o GERD, HTN, HLD, minimal CAD on CT, Persistent AF on eliquis  seen today for routine electrophysiology followup.   Since last being seen in our clinic the patient reports doing very well. Exercising 5 days a week. No AF by symptoms or kardia mobile.  he denies chest pain, palpitations, dyspnea, PND, orthopnea, nausea, vomiting, dizziness, syncope, edema, weight gain, or early satiety.   Review of systems complete and found to be negative unless listed in HPI.   EP Information / Studies Reviewed:    EKG is ordered today. Personal review as below.  EKG Interpretation Date/Time:  Thursday June 06 2023 08:12:12 EST Ventricular Rate:  76 PR Interval:  192 QRS Duration:  104 QT Interval:  382 QTC Calculation: 429 R Axis:   -4  Text Interpretation: Normal sinus rhythm Moderate voltage criteria for LVH, may be normal variant ( R in aVL , Cornell product ) Confirmed by Maxine Glenn [phone]) on 06/06/2023 8:15:36 AM    Studies 08/2021 Coronary CT > coronary calcium score 176 / 57 percentile, minimal CAD in the distal LM, proximal LAD/Lcx, dilated ascending aorta, aortic atherosclerosis, mildly dilated pulmonary artery 09/2022 EKG > SR 81, LAD  09/2022 EST > no QRS widening, ST deviation or significant ectopy 01/15/23 EKG >  personally reviewed, SR 62, normal intervals   Arrhythmia / Device  AF diagnosed 2022, Persistent AF on Eliquis 5mg , cardizem 08/2022 > Pt elected rhythm control/avoid procedures. Started on flecainide with subsequent DCCV    AAD / Anticoagulation  08/2022 Flecainide  Eliquis 5mg  BID, appropriately dosed  Physical Exam:   VS:  BP (!) 140/82   Pulse 76   Ht 6' (1.829 m)   Wt 191 lb 9.6 oz (86.9  kg)   SpO2 95%   BMI 25.99 kg/m    Wt Readings from Last 3 Encounters:  06/06/23 191 lb 9.6 oz (86.9 kg)  01/15/23 185 lb 6.4 oz (84.1 kg)  10/10/22 190 lb (86.2 kg)     GEN: Well nourished, well developed in no acute distress NECK: No JVD; No carotid bruits CARDIAC: Regular rate and rhythm, no murmurs, rubs, gallops RESPIRATORY:  Clear to auscultation without rales, wheezing or rhonchi  ABDOMEN: Soft, non-tender, non-distended EXTREMITIES:  No edema; No deformity   ASSESSMENT AND PLAN:    Persistent Atrial Fibrillation  CHA2DS2VASC 3, continue Eliquis 5 mg BID EKG today shows NSR with stable intervals Stable intervals on flecainide 50 mg BID, continue Continue diltiazem 240 mg daily  HTN Stable on current regimen    Aortic Atherosclerosis Seen on CT -follow up with Dr. Herbie Baltimore on CT Aorta (previously ordered, canceled with AF episode in Feb 2024) -lipid control per Dr. Herbie Baltimore    Secondary hypercoagulable state Pt on Eliquis as above     Follow up with Dr. Elberta Fortis in 6 months  Signed, Graciella Freer, PA-C

## 2023-06-06 ENCOUNTER — Ambulatory Visit: Payer: Medicare PPO | Attending: Cardiology | Admitting: Student

## 2023-06-06 ENCOUNTER — Encounter: Payer: Self-pay | Admitting: Student

## 2023-06-06 VITALS — BP 140/82 | HR 76 | Ht 72.0 in | Wt 191.6 lb

## 2023-06-06 DIAGNOSIS — D6869 Other thrombophilia: Secondary | ICD-10-CM | POA: Diagnosis not present

## 2023-06-06 DIAGNOSIS — I4819 Other persistent atrial fibrillation: Secondary | ICD-10-CM | POA: Diagnosis not present

## 2023-06-06 DIAGNOSIS — Z5181 Encounter for therapeutic drug level monitoring: Secondary | ICD-10-CM | POA: Diagnosis not present

## 2023-06-06 DIAGNOSIS — Z79899 Other long term (current) drug therapy: Secondary | ICD-10-CM

## 2023-06-06 DIAGNOSIS — I10 Essential (primary) hypertension: Secondary | ICD-10-CM | POA: Diagnosis not present

## 2023-06-06 NOTE — Patient Instructions (Signed)
Medication Instructions:  Your physician recommends that you continue on your current medications as directed. Please refer to the Current Medication list given to you today.  *If you need a refill on your cardiac medications before your next appointment, please call your pharmacy*  Lab Work: BMET, CBC--TODAY If you have labs (blood work) drawn today and your tests are completely normal, you will receive your results only by: MyChart Message (if you have MyChart) OR A paper copy in the mail If you have any lab test that is abnormal or we need to change your treatment, we will call you to review the results.  Follow-Up: At [facility], you and your health needs are our priority.  As part of our continuing mission to provide you with exceptional heart care, we have created designated Provider Care Teams.  These Care Teams include your primary Cardiologist (physician) and Advanced Practice Providers (APPs -  Physician Assistants and Nurse Practitioners) who all work together to provide you with the care you need, when you need it.  Your next appointment:   6 month(s)  Provider:   Will Camnitz, MD  

## 2023-06-07 LAB — BASIC METABOLIC PANEL
BUN/Creatinine Ratio: 20 (ref 10–24)
BUN: 18 mg/dL (ref 8–27)
CO2: 25 mmol/L (ref 20–29)
Calcium: 8.8 mg/dL (ref 8.6–10.2)
Chloride: 104 mmol/L (ref 96–106)
Creatinine, Ser: 0.9 mg/dL (ref 0.76–1.27)
Glucose: 118 mg/dL — ABNORMAL HIGH (ref 70–99)
Potassium: 4.2 mmol/L (ref 3.5–5.2)
Sodium: 141 mmol/L (ref 134–144)
eGFR: 92 mL/min/{1.73_m2} (ref >59)

## 2023-06-07 LAB — CBC
Hematocrit: 46 % (ref 37.5–51.0)
Hemoglobin: 15.2 g/dL (ref 13.0–17.7)
MCH: 31.1 pg (ref 26.6–33.0)
MCHC: 33 g/dL (ref 31.5–35.7)
MCV: 94 fL (ref 79–97)
Platelets: 185 10*3/uL (ref 150–450)
RBC: 4.88 x10E6/uL (ref 4.14–5.80)
RDW: 12.2 % (ref 11.6–15.4)
WBC: 4.6 10*3/uL (ref 3.4–10.8)

## 2023-06-27 ENCOUNTER — Other Ambulatory Visit: Payer: Self-pay | Admitting: Cardiology

## 2023-06-27 DIAGNOSIS — I4819 Other persistent atrial fibrillation: Secondary | ICD-10-CM

## 2023-07-01 NOTE — Telephone Encounter (Signed)
Prescription refill request for Eliquis received. Indication: Afib  Last office visit: 06/06/23 Lanna Poche)  Scr: 0.90 (06/06/23)  Age: 72 Weight: 86.9kg  Appropriate dose. Refill sent.

## 2023-08-16 ENCOUNTER — Ambulatory Visit: Payer: Medicare PPO | Admitting: Cardiology

## 2023-08-23 ENCOUNTER — Ambulatory Visit: Payer: Medicare PPO | Attending: Cardiology | Admitting: Cardiology

## 2023-08-23 ENCOUNTER — Encounter: Payer: Self-pay | Admitting: Cardiology

## 2023-08-23 VITALS — BP 130/78 | HR 75 | Ht 72.0 in | Wt 192.0 lb

## 2023-08-23 DIAGNOSIS — I700 Atherosclerosis of aorta: Secondary | ICD-10-CM

## 2023-08-23 DIAGNOSIS — I4819 Other persistent atrial fibrillation: Secondary | ICD-10-CM | POA: Diagnosis not present

## 2023-08-23 DIAGNOSIS — I77810 Thoracic aortic ectasia: Secondary | ICD-10-CM

## 2023-08-23 DIAGNOSIS — D6869 Other thrombophilia: Secondary | ICD-10-CM

## 2023-08-23 DIAGNOSIS — I10 Essential (primary) hypertension: Secondary | ICD-10-CM

## 2023-08-23 DIAGNOSIS — Z0000 Encounter for general adult medical examination without abnormal findings: Secondary | ICD-10-CM

## 2023-08-23 DIAGNOSIS — I480 Paroxysmal atrial fibrillation: Secondary | ICD-10-CM

## 2023-08-23 DIAGNOSIS — E785 Hyperlipidemia, unspecified: Secondary | ICD-10-CM

## 2023-08-23 NOTE — Patient Instructions (Addendum)
Medication Instructions:   No changes   *If you need a refill on your cardiac medications before your next appointment, please call your pharmacy*   Lab Work:  Not needed   Testing/Procedures: Will be schedule  Cardiac CT Angiography (CTA)of aorta, is a special type of CT scan that uses a computer to produce multi-dimensional views of  aorta. In CT angiography, a contrast material is injected through an IV to help visualize the blood vessels    Follow-Up: At [facility], you and your health needs are our priority.  As part of our continuing mission to provide you with exceptional heart care, we have created designated Provider Care Teams.  These Care Teams include your primary Cardiologist (physician) and Advanced Practice Providers (APPs -  Physician Assistants and Nurse Practitioners) who all work together to provide you with the care you need, when you need it.     Your next appointment:   12 month(s)  The format for your next appointment:   In Person  Provider:   Bryan Lemma, MD

## 2023-08-23 NOTE — Progress Notes (Unsigned)
Cardiology Office Note:  .   Date:  08/25/2023  ID:  Albert [name], DOB 04/30/1951, MRN [id number] PCP: Bailey Mech, PA-C  [facility] Providers Cardiologist:  Bryan Lemma, MD Electrophysiologist:  Will Jorja Loa, MD     Chief Complaint  Patient presents with   Follow-up    12 months.   Atrial Fibrillation    No breakthrough since starting flecainide    Patient Profile: .     Albert [name] is a 73 y.o. male  with a PMH notable for Persistent A-Fib, HTN HLD, Coronary Artery Calcification/Aortic Atherosclerosis who presents here for ~ annual f/u at the request of [name], Harrell Lark*.  A-fib diagnosed December 2022 with DCCV (although symptoms probably began in summer 2022) Coronary CTA (08/31/2021): Coronary Calcium Score 176 with minimal distal LM and proximal LAD/LCx stenosis of less than 24%.  Nonobstructive. HTN & HLD   I last saw [name] on August 15, 2022 for routine follow-up.  He was doing fairly well.  Exercising at the [facility] Monday through Friday.  No symptoms of A-fib or chest pain.  Distant irregular sensation and occasional fluttering.  No tachycardia noted 1 episode of Kardia-Mobile that may have been A-fib.  Did not last very long.  Not long enough to take diltiazem.  We reviewed the results of his coronary CTA which was very reassuring.  He was working on weight loss.  Borderline elevated BP.  I did order a CT angio of the chest to evaluate dilated thoracic aorta (unfortunately missed and canceled because he was in A-fib.  He was referred to the A-fib clinic  He had been seen several times by EP in the A-fib clinic in 2024 -> he was initiated on flecainide in February 2024 along with Eliquis 5 mg twice daily.  ETT for flecainide performed.  He was last seen on 06/06/2023 by M.  Alejandro Mulling, PA.  He was doing well.  Exercising 5 days a week.  No A-fib symptoms or episodes noted on Kardia-Mobile.  Denying any chest pain,  palpitations or dyspnea.  No PND or orthopnea.  No bleeding issues.  Subjective  Discussed the use of AI scribe software for clinical note transcription with the patient, who gave verbal consent to proceed.  History of Present Illness   The patient, with a history of persistent atrial fibrillation (AFib), hypertension, and hyperlipidemia, presents for a routine follow-up. The patient has been managed with flecainide, diltiazem, and Eliquis, and reports no recent episodes of AFib since starting flecainide a year ago. The patient describes a sensation of agitation and anxiety when in AFib, but has not experienced these symptoms recently.  The patient's hypertension and hyperlipidemia are reportedly well-controlled, with blood pressure readings ranging from 112/72 to 135/78 and a heart rate in the 60s to 70s. The patient has not noticed any blood in stools or urine, suggestive of bleeding complications from Eliquis.  The patient has been adherent to the prescribed medication regimen, taking diltiazem and flecainide as directed - Diltiazem CD 240 mg daily & flecainide 50 mg BID.  The patient has not needed to use the as-needed diltiazem or additional flecainide for breakthrough episodes of Afib.   The patient also reports a history of a mildly dilated thoracic aorta, identified on a previous CT scan. A follow-up scan was scheduled but was cancelled due to an episode of AFib and has not been rescheduled. The patient has not reported any symptoms suggestive of aortic dissection or rupture.  The  patient remains active despite occasional knee pain, which sometimes limits physical activity. The patient manages this with a copper fit brace and continues to exercise regularly at a local gym.   He reminded me that he never did have the CTA Aorta to assess Aortic Dilation that was ordered last year.     Cardiovascular ROS: no chest pain or dyspnea on exertion negative for - edema, irregular heartbeat,  orthopnea, palpitations, paroxysmal nocturnal dyspnea, rapid heart rate, shortness of breath, or syncope/near syncope, TIA/amaurosis fugax; melena, hematochezia, hematuria, epistaxis  ROS:  Review of Systems - Negative except R knee OA pain on occasion.    Objective   Medications - Flecainide 50 mg BID (100 mg x 1 then BID until conversion for breakthrough Afib) - Diltiazem CD 240 mg (with 30 mg PRN dosing for breakthrough AFib) - Eliquis 5 mg BID - Lisinopril 20 mg daily - rosuvastatin 20 mg daily  Studies Reviewed: Marland Kitchen   EKG Interpretation Date/Time:  Friday August 23 2023 09:16:57 EST Ventricular Rate:  75 PR Interval:  192 QRS Duration:  104 QT Interval:  402 QTC Calculation: 448 R Axis:   -28  Text Interpretation: Normal sinus rhythm Non-specific intra-ventricular conduction delay When compared with ECG of 06-Jun-2023 08:12, No significant change was found Confirmed by Bryan Lemma ([id number]) on 08/23/2023 9:31:01 AM    ECHO December 2023: Normal LV size and function with an EF 60 to 65%.  No RWMA.  Unable to assess TID.  Mild LA dilation.  Mild aortic sclerosis but no stenosis.  Borderline elevated RAP  ETT 10/25/2022: Exercised 10 minutes achieving 11.7 METS.  Max heart rate 141 which is 93% MPHR.  No ST segment changes noted.  No QRS widening or ectopy noted during stress.  NO ISCHEMIC changes.  Ordered for flecainide monitoring  No results found for: "CHOL", "HDL", "LDLCALC", "LDLDIRECT", "TRIG", "CHOLHDL"   Risk Assessment/Calculations:    CHA2DS2-VASc Score = 3   This indicates a 3.2% annual risk of stroke. The patient's score is based upon: CHF History: 0 HTN History: 1 Diabetes History: 0 Stroke History: 0 Vascular Disease History: 1 Age Score: 1 Gender Score: 0   On Eliquis      Physical Exam:   VS:  BP 130/78 (BP Location: Left Arm, Patient Position: Sitting, Cuff Size: Normal)   Pulse 75   Ht 6' (1.829 m)   Wt 192 lb (87.1 kg)   BMI 26.04 kg/m     Wt Readings from Last 3 Encounters:  08/23/23 192 lb (87.1 kg)  06/06/23 191 lb 9.6 oz (86.9 kg)  01/15/23 185 lb 6.4 oz (84.1 kg)    GEN: Well nourished, well groomed in no acute distress; healthy-appearing.   NECK: No JVD; No carotid bruits CARDIAC: Normal S1, S2; RRR, no murmurs, rubs, gallops RESPIRATORY:  Clear to auscultation without rales, wheezing or rhonchi ; nonlabored, good air movement. ABDOMEN: Soft, non-tender, non-distended EXTREMITIES:  No edema; No deformity     ASSESSMENT AND PLAN: .    Problem List Items Addressed This Visit       Cardiology Problems   Essential hypertension (Chronic)   Well controlled with Diltiazem CD 240mg , and lisinopril 20 mg. -Continue current dose of diltiazem and lisinopril.      Relevant Orders   EKG 12-Lead (Completed)   Basic metabolic panel (Completed)   Hypercoagulable state due to paroxysmal atrial fibrillation (HCC) (Chronic)   CHA2DS2-VASc score of 3 based on hypertension, age and vascular disease. Maintained on  Eliquis 5 mg twice daily. He does voice concerns of cost. - Continue Eliquis at current dose for CVA prophylaxis.  - Look into cost having options. - Okay to hold Eliquis 2 to 3 days preop for surgeries or procedures.  Restart when safe.      Hyperlipidemia LDL goal <100 (Chronic)   Last lipid panel in January 2024 showed good control. -Check lipid panel soon to ensure continued control.      Persistent atrial fibrillation (HCC): CHA2DS2-VASc 3 -> Eliquis - Primary (Chronic)   Stable on Flecainide 50mg  BID and Diltiazem 240mg  daily. No recent episodes of AFib. Discussed the importance of consistent dosing and the potential for breakthrough AFib episodes. Continue to monitor with Franciso Bend if he notes symptoms that are usually associated with A-fib. -Continue Flecainide 50mg  BID and Diltiazem 240mg  daily. -In the event of a breakthrough AFib episode, increase Flecainide to 100mg  for that dose and the  following dose, not to exceed 200mg  in a day.   - This is in addition to taking the PRN short acting diltiazem 30. - Continue flecainide to 100 mg twice daily until converted      Relevant Orders   EKG 12-Lead (Completed)   Basic metabolic panel (Completed)   Thoracic aortic ectasia (HCC) (Chronic)   Noted on Coronary CTA [facility] plan was to do a follow-up study last year that got canceled because of A-fib recurrence.  Will reorder for now. - Order CTA Chest-Aorta -Check BMP to assess renal function prior to CT scan due to contrast use.      Relevant Orders   CT ANGIO CHEST AORTA W/CM & OR WO/CM   Basic metabolic panel (Completed)     Other   Healthcare maintenance   - Continue current dose of diltiazem and and flecainide for A-fib rhythm/rate.  Controlled with breakthrough options. -Continue lisinopril for additional blood pressure control with rosuvastatin-management -Continue Eliquis for stroke prevention in AFib. -Report any signs of bleeding to the doctor.        Follow-Ups: -Follow up with Dr. Hezzie Bump (EP) in May.  Return in about 1 year (around 08/22/2024) for Routine follow up with me.  I spent 51 minutes in the care of Rangel Echeverri today including reviewing labs (2 min), reviewing studies (5 min), face to face time discussing treatment options (26 min), reviewing records from several A-fib clinic and EP visit notes (7 minutes), 11 minutes dictating, and documenting in the encounter.     Signed, Marykay Lex, MD, MS Bryan Lemma, M.D., M.S. Interventional Cardiologist  [facility]  Pager # [phone] Phone # [phone] [address] [place], [place] [address]

## 2023-08-24 LAB — BASIC METABOLIC PANEL
BUN/Creatinine Ratio: 15 (ref 10–24)
BUN: 13 mg/dL (ref 8–27)
CO2: 24 mmol/L (ref 20–29)
Calcium: 9.3 mg/dL (ref 8.6–10.2)
Chloride: 105 mmol/L (ref 96–106)
Creatinine, Ser: 0.89 mg/dL (ref 0.76–1.27)
Glucose: 102 mg/dL — ABNORMAL HIGH (ref 70–99)
Potassium: 4.6 mmol/L (ref 3.5–5.2)
Sodium: 144 mmol/L (ref 134–144)
eGFR: 92 mL/min/{1.73_m2} (ref >59)

## 2023-08-25 ENCOUNTER — Encounter: Payer: Self-pay | Admitting: Cardiology

## 2023-08-25 DIAGNOSIS — Z0000 Encounter for general adult medical examination without abnormal findings: Secondary | ICD-10-CM | POA: Insufficient documentation

## 2023-08-25 NOTE — Assessment & Plan Note (Signed)
Last lipid panel in January 2024 showed good control. -Check lipid panel soon to ensure continued control.

## 2023-08-25 NOTE — Assessment & Plan Note (Signed)
CHA2DS2-VASc score of 3 based on hypertension, age and vascular disease. Maintained on Eliquis 5 mg twice daily. He does voice concerns of cost. - Continue Eliquis at current dose for CVA prophylaxis.  - Look into cost having options. - Okay to hold Eliquis 2 to 3 days preop for surgeries or procedures.  Restart when safe.

## 2023-08-25 NOTE — Assessment & Plan Note (Signed)
-   Continue current dose of diltiazem and and flecainide for A-fib rhythm/rate.  Controlled with breakthrough options. -Continue lisinopril for additional blood pressure control with rosuvastatin-management -Continue Eliquis for stroke prevention in AFib. -Report any signs of bleeding to the doctor.

## 2023-08-25 NOTE — Assessment & Plan Note (Addendum)
Noted on Coronary CTA [facility] plan was to do a follow-up study last year that got canceled because of A-fib recurrence.  Will reorder for now. - Order CTA Chest-Aorta -Check BMP to assess renal function prior to CT scan due to contrast use.

## 2023-08-25 NOTE — Assessment & Plan Note (Signed)
Well controlled with Diltiazem CD 240mg , and lisinopril 20 mg. -Continue current dose of diltiazem and lisinopril.

## 2023-08-25 NOTE — Assessment & Plan Note (Signed)
Stable on Flecainide 50mg  BID and Diltiazem 240mg  daily. No recent episodes of AFib. Discussed the importance of consistent dosing and the potential for breakthrough AFib episodes. Continue to monitor with Albert [name] if he notes symptoms that are usually associated with A-fib. -Continue Flecainide 50mg  BID and Diltiazem 240mg  daily. -In the event of a breakthrough AFib episode, increase Flecainide to 100mg  for that dose and the following dose, not to exceed 200mg  in a day.   - This is in addition to taking the PRN short acting diltiazem 30. - Continue flecainide to 100 mg twice daily until converted

## 2023-09-04 ENCOUNTER — Ambulatory Visit (HOSPITAL_BASED_OUTPATIENT_CLINIC_OR_DEPARTMENT_OTHER)
Admission: RE | Admit: 2023-09-04 | Discharge: 2023-09-04 | Disposition: A | Discharge status: Home / Self Care | Payer: Medicare PPO | Source: Ambulatory Visit | Attending: Cardiology | Admitting: Cardiology

## 2023-09-04 DIAGNOSIS — I77810 Thoracic aortic ectasia: Secondary | ICD-10-CM | POA: Diagnosis present

## 2023-09-04 MED ORDER — IOHEXOL 350 MG/ML SOLN
100.0000 mL | Freq: Once | INTRAVENOUS | Status: AC | PRN
  Administered 2023-09-04: 80 mL via INTRAVENOUS

## 2023-09-06 ENCOUNTER — Encounter: Payer: Self-pay | Admitting: Cardiology

## 2023-11-25 ENCOUNTER — Other Ambulatory Visit: Payer: Self-pay

## 2023-11-25 ENCOUNTER — Ambulatory Visit
Admission: EM | Admit: 2023-11-25 | Discharge: 2023-11-25 | Disposition: A | Discharge status: Home / Self Care | Attending: Physician Assistant | Admitting: Physician Assistant

## 2023-11-25 DIAGNOSIS — M79671 Pain in right foot: Secondary | ICD-10-CM | POA: Diagnosis not present

## 2023-11-25 DIAGNOSIS — G8929 Other chronic pain: Secondary | ICD-10-CM

## 2023-11-25 DIAGNOSIS — R21 Rash and other nonspecific skin eruption: Secondary | ICD-10-CM | POA: Diagnosis not present

## 2023-11-25 MED ORDER — TRIAMCINOLONE ACETONIDE 0.1 % EX CREA: 1.0000 | TOPICAL_CREAM | Freq: Two times a day (BID) | CUTANEOUS | 0 refills | Status: AC

## 2023-11-25 NOTE — ED Triage Notes (Signed)
 Pt presents with complaints of red, itchy bumps/rash on right lower leg (ankle area). Pt states he noticed the red bumps on Saturday 4/26. OTC Hydrocortisone cream + Benadryl gel applied with little relief. Pt states his itching worsens at night. Currently denies pain, just voices discomfort.

## 2023-11-25 NOTE — ED Provider Notes (Signed)
 [facility]    CSN: [id number] Arrival date & time: 11/25/23  1424      History   Chief Complaint Chief Complaint  Patient presents with   Rash    HPI Albert [name] is a 73 y.o. male.   HPI  He reports concerns for raised itchy red spots on right lower leg He reports he has tried benadryl cream and cortisone cream which has provided some relief  He denies being outdoors or in the woods and denies similar symptoms in other members of home   Patient reports that he has been having intermittent right heel pain for the past few months.  He reports that this is more prevalent when exercising at the gym or when standing.  He denies pain to the touch, decreased range of motion of the ankle or foot.  He does report sometimes the pain seems to radiate up his leg into the lower back but denies previous history of low back pain, sciatica.  Past Medical History:  Diagnosis Date   Allergic rhinitis    Aortic atherosclerosis (HCC) 06/2021   Noted on CT scan   BPH (benign prostatic hyperplasia)    Coronary artery calcification seen on CAT scan 06/2021   GERD (gastroesophageal reflux disease)    Hyperlipidemia    Hypertension    Persistent atrial fibrillation (HCC) 06/30/2021   CHA2DS2-VASc score 3-on Eliquis  and diltiazem     Patient Active Problem List   Diagnosis Date Noted   Healthcare maintenance 08/25/2023   Persistent atrial fibrillation (HCC): CHA2DS2-VASc 3 -> Eliquis  07/10/2021   Hypercoagulable state due to paroxysmal atrial fibrillation (HCC) 07/10/2021   Hypokalemia 07/01/2021   Essential hypertension    Hyperlipidemia LDL goal <100    Atrial fibrillation with RVR (HCC) 06/30/2021   Coronary artery calcification seen on CAT scan 06/2021   Thoracic aortic ectasia (HCC) 06/2021    Past Surgical History:  Procedure Laterality Date   CARDIOVERSION N/A 07/27/2021   Procedure: CARDIOVERSION;  Surgeon: Hazle Lites, MD;  Location: [facility] ENDOSCOPY;   Service: Cardiovascular;  Laterality: N/A;   CARDIOVERSION N/A 09/25/2022   Procedure: CARDIOVERSION;  Surgeon: Wendie Hamburg, MD;  Location: [facility] ENDOSCOPY;  Service: Cardiovascular;  Laterality: N/A;   CT CTA CORONARY W/CA SCORE W/CM &/OR WO/CM  08/31/2021   Coronary calcium  score of 176.  Normal coronary origin with right dominance. Minimal CAD in the distal LM and proximal LAD/Lcx as outlined above.  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   TRANSTHORACIC ECHOCARDIOGRAM  06/2021   (In setting of A. fib)EF 60 to 65%.  Mild LAE. Mildly elevated RAP.       Home Medications    Prior to Admission medications   Medication Sig Start Date End Date Taking? Authorizing Provider  triamcinolone cream (KENALOG) 0.1 % Apply 1 Application topically 2 (two) times daily. 11/25/23  Yes Kenric Ginger E, PA-C  Cholecalciferol 50 MCG (2000 UT) TABS Take 2,000 Units by mouth daily.    [provider]  diltiazem  (CARDIZEM  CD) 240 MG 24 hr capsule Take 1 capsule (240 mg total) by mouth daily. 04/02/23   Arleen Lacer, MD  diltiazem  (CARDIZEM ) 30 MG tablet Take 1 tablet (30 mg total) by mouth every 8 (eight) hours as needed (HR>95 or afib ,). 08/15/22   Arleen Lacer, MD  ELIQUIS  5 MG TABS tablet TAKE 1 TABLET BY MOUTH 2 TIMES A DAY 07/01/23   Randene Bustard  W, MD  flecainide  (TAMBOCOR ) 50 MG tablet Take 1 tablet (50 mg total) by mouth 2 (two) times daily. 01/07/23   Camnitz, Babetta Lesch, MD  lisinopril  (ZESTRIL ) 20 MG tablet TAKE ONE TABLET BY MOUTH AT BEDTIME 11/07/22   Arleen Lacer, MD  rosuvastatin  (CRESTOR ) 20 MG tablet Take 20 mg by mouth daily. 09/05/22   [provider]  vitamin B-12 (CYANOCOBALAMIN) 100 MCG tablet Take 100 mcg by mouth daily.    [provider]  VYZULTA 0.024 % SOLN Apply to eye. 11/15/22   [provider]    Family History History reviewed. No pertinent family history.  Social  History Social History   Tobacco Use   Smoking status: Never   Smokeless tobacco: Never  Vaping Use   Vaping status: Never Used  Substance Use Topics   Alcohol use: Not Currently   Drug use: Not Currently     Allergies   Patient has no known allergies.   Review of Systems Review of Systems   Physical Exam Triage Vital Signs ED Triage Vitals  Encounter Vitals Group     BP 11/25/23 1542 (!) 153/79     Systolic BP Percentile --      Diastolic BP Percentile --      Pulse Rate 11/25/23 1542 79     Resp 11/25/23 1542 18     Temp 11/25/23 1542 97.8 F (36.6 C)     Temp Source 11/25/23 1542 Oral     SpO2 11/25/23 1542 96 %     Weight 11/25/23 1544 185 lb (83.9 kg)     Height 11/25/23 1544 6' (1.829 m)     Head Circumference --      Peak Flow --      Pain Score 11/25/23 1543 0     Pain Loc --      Pain Education --      Exclude from Growth Chart --    No data found.  Updated Vital Signs BP (!) 153/79 (BP Location: Right Arm) Comment: pt has taken BP medication this morning.  Pulse 79   Temp 97.8 F (36.6 C) (Oral)   Resp 18   Ht 6' (1.829 m)   Wt 185 lb (83.9 kg)   SpO2 96%   BMI 25.09 kg/m   Visual Acuity Right Eye Distance:   Left Eye Distance:   Bilateral Distance:    Right Eye Near:   Left Eye Near:    Bilateral Near:     Physical Exam Vitals reviewed.  Constitutional:      General: He is awake.     Appearance: Normal appearance. He is well-developed and well-groomed.  HENT:     Head: Normocephalic and atraumatic.  Eyes:     Extraocular Movements: Extraocular movements intact.     Conjunctiva/sclera: Conjunctivae normal.  Pulmonary:     Effort: Pulmonary effort is normal.  Musculoskeletal:     Cervical back: Normal range of motion.  Skin:    General: Skin is warm and dry.     Findings: Erythema and rash present. Rash is macular and papular.     Comments: Patient has numerous, isolated maculopapular lesions along the lower right leg.  Each  lesion is approximately 0.5 to 1 cm diameter. There is no evidence of streaking, swelling, spreading redness, purulent drainage or bleeding.  Neurological:     General: No focal deficit present.     Mental Status: He is alert and oriented to person, place, and time.  GCS: GCS eye subscore is 4. GCS verbal subscore is 5. GCS motor subscore is 6.  Psychiatric:        Attention and Perception: Attention normal.        Mood and Affect: Mood normal.        Speech: Speech normal.        Behavior: Behavior normal. Behavior is cooperative.      UC Treatments / Results  Labs (all labs ordered are listed, but only abnormal results are displayed) Labs Reviewed - No data to display  EKG   Radiology No results found.  Procedures Procedures (including critical care time)  Medications Ordered in UC Medications - No data to display  Initial Impression / Assessment and Plan / UC Course  I have reviewed the triage vital signs and the nursing notes.  Pertinent labs & imaging results that were available during my care of the patient were reviewed by me and considered in my medical decision making (see chart for details).      Final Clinical Impressions(s) / UC Diagnoses   Final diagnoses:  Rash and nonspecific skin eruption  Heel pain, chronic, right   Patient presents today with concern for rash on the lower right leg that has been present since Saturday, 11/23/2023.  He reports that he has applied hydrocortisone cream and Benadryl cream to the area but this has not provided notable relief.  Physical exam is notable for small, isolated erythematous maculopapular lesions along the right lower leg.  At this time suspect potential insect bite but unsure of definitive etiology.  Will treat with Kenalog cream to assist with itching and recommend starting a second-generation antihistamine as needed to assist with discomfort and itching.  Patient also mentions concerns for ongoing, chronic  right heel pain that typically flares during exercise and weightbearing activities.  He also reports some pain that radiates from the low back down the leg to the heel.  Reviewed with him that this could be sciatica, plantar fasciitis, heel pad atrophy.  Will provide patient education materials regarding heel pad atrophy and plantar fasciitis as well as potential stretches to assist with symptoms.  Recommend close follow-up with primary care provider if symptoms are not improving over the next 2 to 3 weeks.  Follow-up as needed.    Discharge Instructions      You were seen today for concerns of a rash on your lower right leg.  At this time it is difficult to say what is causing this but we can provide some treatment options to help reduce your symptoms and make you more comfortable.  At this time I have sent in a medication called Kenalog cream for you to apply to the affected area 1-2 times per day as needed.  This is a steroid that helps reduce irritation and itching.  Please do not use on the same space of the body for more than 2 weeks as it can cause skin changes. I also recommend starting an antihistamine such as Zyrtec, Claritin, or Allegra per your preference.  This can help reduce the histamine response that causes itching and provide some relief as well. Please try to refrain from scratching at the area as this can cause skin breakdown and introduce bacteria to the areas.  You can keep the area clean with warm water and a gentle soap and then pat to dry.  We also discussed your concerns for right-sided heel pain.  At this time your symptoms could be consistent with heel  pad atrophy or plantar fasciitis.  I have included some information regarding both conditions for you to review as well as some stretches for you to try to assist with potential plantar fasciitis.  If your symptoms are persisting and not improving despite stretches and increased foot support I recommend following up with your  primary care provider for further evaluation and management.     ED Prescriptions     Medication Sig Dispense Auth. Provider   triamcinolone cream (KENALOG) 0.1 % Apply 1 Application topically 2 (two) times daily. 30 g Beverly Suriano E, PA-C      PDMP not reviewed this encounter.   Jerona Mooring, PA-C 11/25/23 1027

## 2023-11-25 NOTE — Discharge Instructions (Addendum)
 You were seen today for concerns of a rash on your lower right leg.  At this time it is difficult to say what is causing this but we can provide some treatment options to help reduce your symptoms and make you more comfortable.  At this time I have sent in a medication called Kenalog cream for you to apply to the affected area 1-2 times per day as needed.  This is a steroid that helps reduce irritation and itching.  Please do not use on the same space of the body for more than 2 weeks as it can cause skin changes. I also recommend starting an antihistamine such as Zyrtec, Claritin, or Allegra per your preference.  This can help reduce the histamine response that causes itching and provide some relief as well. Please try to refrain from scratching at the area as this can cause skin breakdown and introduce bacteria to the areas.  You can keep the area clean with warm water and a gentle soap and then pat to dry.  We also discussed your concerns for right-sided heel pain.  At this time your symptoms could be consistent with heel pad atrophy or plantar fasciitis.  I have included some information regarding both conditions for you to review as well as some stretches for you to try to assist with potential plantar fasciitis.  If your symptoms are persisting and not improving despite stretches and increased foot support I recommend following up with your primary care provider for further evaluation and management.

## 2023-12-17 ENCOUNTER — Encounter: Payer: Self-pay | Admitting: Cardiology

## 2023-12-17 ENCOUNTER — Ambulatory Visit: Attending: Cardiology | Admitting: Cardiology

## 2023-12-17 VITALS — BP 140/92 | HR 71 | Ht 72.0 in | Wt 185.0 lb

## 2023-12-17 DIAGNOSIS — Z79899 Other long term (current) drug therapy: Secondary | ICD-10-CM

## 2023-12-17 DIAGNOSIS — I10 Essential (primary) hypertension: Secondary | ICD-10-CM

## 2023-12-17 DIAGNOSIS — I4819 Other persistent atrial fibrillation: Secondary | ICD-10-CM

## 2023-12-17 MED ORDER — LISINOPRIL 20 MG PO TABS: 20.0000 mg | ORAL_TABLET | Freq: Two times a day (BID) | ORAL | 1 refills | Status: DC

## 2023-12-17 NOTE — Progress Notes (Signed)
  Electrophysiology Office Note:   Date:  12/17/2023  ID:  Albert [name], DOB 1950/10/24, MRN [id number]  Primary Cardiologist: Randene Bustard, MD Primary Heart Failure: None Electrophysiologist: Wilhelmenia Addis Cortland Ding, MD      History of Present Illness:   Albert [name] is a 73 y.o. male with h/o atrial fibrillation, hypertension, coronary calcifications seen today for routine electrophysiology followup.   Since last being seen in our clinic the patient reports doing well.  He has noted no further episodes of atrial fibrillation.  He has noted at home that his blood pressure has been somewhat elevated, in the 140s to 150s.  He previously took twice daily lisinopril  20 mg.  He does not have any headaches, fatigue, shortness of breath.  He continues to be able to do his daily activities.  he denies chest pain, palpitations, dyspnea, PND, orthopnea, nausea, vomiting, dizziness, syncope, edema, weight gain, or early satiety.   Review of systems complete and found to be negative unless listed in HPI.   EP Information / Studies Reviewed:    EKG is ordered today. Personal review as below.  EKG Interpretation Date/Time:  Tuesday Dec 17 2023 10:51:57 EDT Ventricular Rate:  71 PR Interval:  194 QRS Duration:  98 QT Interval:  394 QTC Calculation: 428 R Axis:   -9  Text Interpretation: Normal sinus rhythm Normal ECG When compared with ECG of 23-Aug-2023 09:16, No significant change was found Confirmed by Malichi Palardy ([id number]) on 12/17/2023 11:04:54 AM  Risk Assessment/Calculations:    CHA2DS2-VASc Score = 3   This indicates a 3.2% annual risk of stroke. The patient's score is based upon: CHF History: 0 HTN History: 1 Diabetes History: 0 Stroke History: 0 Vascular Disease History: 1 Age Score: 1 Gender Score: 0            Physical Exam:   VS:  BP (!) 140/92 (BP Location: Left Arm, Patient Position: Sitting, Cuff Size: Large)   Pulse 71   Ht 6' (1.829 m)   Wt 185 lb (83.9 kg)    SpO2 97%   BMI 25.09 kg/m    Wt Readings from Last 3 Encounters:  12/17/23 185 lb (83.9 kg)  11/25/23 185 lb (83.9 kg)  08/23/23 192 lb (87.1 kg)     GEN: Well nourished, well developed in no acute distress NECK: No JVD; No carotid bruits CARDIAC: Regular rate and rhythm, no murmurs, rubs, gallops RESPIRATORY:  Clear to auscultation without rales, wheezing or rhonchi  ABDOMEN: Soft, non-tender, non-distended EXTREMITIES:  No edema; No deformity   ASSESSMENT AND PLAN:    1.  Persistent atrial fibrillation: On diltiazem  and flecainide .  No further episodes of atrial fibrillation.  Albert [name] continue with current management.  2.  Secondary hypercoagulable state: On Eliquis  for atrial fibrillation  3.  Hypertension: Blood pressure is elevated today.  Albert [name] increase lisinopril  to 20 mg twice daily.  He Albert [name] come back in 2 weeks for BMP.  4.  Coronary atherosclerosis: Noted on CT scan.  Plan per primary cardiology  5.  High risk medication monitoring: On flecainide .  Intervals remained stable.  Follow up with Afib Clinic in 6 months  Signed, Hal Norrington Cortland Ding, MD

## 2023-12-17 NOTE — Patient Instructions (Signed)
 Medication Instructions:  Your physician has recommended you make the following change in your medication:  INCREASE Lisinopril  to 20 mg TWICE a day  *If you need a refill on your cardiac medications before your next appointment, please call your pharmacy*  Lab Work: Your physician recommends that you return for lab work in: 2 weeks for a BMET  If you have any lab test that is abnormal or we need to change your treatment, we will call you to review the results.  Testing/Procedures: None ordered  Follow-Up: At [facility], you and your health needs are our priority.  As part of our continuing mission to provide you with exceptional heart care, our providers are all part of one team.  This team includes your primary Cardiologist (physician) and Advanced Practice Providers or APPs (Physician Assistants and Nurse Practitioners) who all work together to provide you with the care you need, when you need it.  Your next appointment:   6 month(s)  Provider:   You will follow up in the Atrial Fibrillation Clinic located at [facility]. Your provider will be: Clint R. Fenton, PA-C or Minnie Amber, PA-C     Thank you for choosing [employer]!!   Reece Cane, RN [phone]

## 2023-12-24 ENCOUNTER — Other Ambulatory Visit: Payer: Self-pay | Admitting: Cardiology

## 2023-12-24 DIAGNOSIS — I4819 Other persistent atrial fibrillation: Secondary | ICD-10-CM

## 2023-12-24 NOTE — Telephone Encounter (Signed)
 Pt last saw Dr Lawana Pray 12/17/23, last labs 08/23/23 Creat 0.89, age 73, weight 83.9kg, based on specified crietria pt is on appropriate dosage of Eliquis  5mg  BID for afib.  Will refill rx.

## 2023-12-31 ENCOUNTER — Other Ambulatory Visit: Payer: Self-pay

## 2023-12-31 LAB — BASIC METABOLIC PANEL WITH GFR
BUN/Creatinine Ratio: 16 (ref 10–24)
BUN: 15 mg/dL (ref 8–27)
CO2: 20 mmol/L (ref 20–29)
Calcium: 9 mg/dL (ref 8.6–10.2)
Chloride: 102 mmol/L (ref 96–106)
Creatinine, Ser: 0.94 mg/dL (ref 0.76–1.27)
Glucose: 101 mg/dL — ABNORMAL HIGH (ref 70–99)
Potassium: 4.2 mmol/L (ref 3.5–5.2)
Sodium: 140 mmol/L (ref 134–144)
eGFR: 87 mL/min/{1.73_m2} (ref >59)

## 2023-12-31 MED ORDER — FLECAINIDE ACETATE 50 MG PO TABS: 50.0000 mg | ORAL_TABLET | Freq: Two times a day (BID) | ORAL | 3 refills | Status: AC

## 2024-01-01 ENCOUNTER — Ambulatory Visit: Payer: Self-pay | Admitting: *Deleted

## 2024-04-07 ENCOUNTER — Telehealth: Payer: Self-pay

## 2024-04-07 NOTE — Telephone Encounter (Signed)
   Pre-operative Risk Assessment    Patient Name: Albert [name]  DOB: Apr 14, 1951 MRN: [id number]   Date of last office visit: 12/17/23 WILL CAMNITZ, MD Date of next office visit: NONE   Request for Surgical Clearance    Procedure:  COLONOSCOPY  Date of Surgery:  Clearance 05/12/24                                Surgeon:  DR OLIVA BOOTS  Surgeon's Group or Practice Name:  [facility] Phone number:  [phone] Fax number:  [phone]   Type of Clearance Requested:   - Medical  - Pharmacy:  Hold Apixaban  (Eliquis ) HOLD FROM 10/12-10/14   Type of Anesthesia:  PROPOFOL    Additional requests/questions:    Signed, Lucie DELENA Ku   04/07/2024, 11:28 AM

## 2024-04-07 NOTE — Telephone Encounter (Signed)
   Name: Albert [name]  DOB: [birth year]  MRN: [id number]  Primary Cardiologist: Alm Clay, MD   Preoperative team, please contact this patient and set up a phone call appointment for further preoperative risk assessment. Please obtain consent and complete medication review. Thank you for your help.  I confirm that guidance regarding antiplatelet and oral anticoagulation therapy has been completed and, if necessary, noted below.  Per office protocol, patient can hold Eliquis  for 2 days prior to procedure.   Patient will not need bridging with Lovenox (enoxaparin) around procedure.  I also confirmed the patient resides in the state of [place] . As per [facility] Medical Board telemedicine laws, the patient must reside in the state in which the provider is licensed.   Albert CHRISTELLA Beauvais, NP 04/07/2024, 1:32 PM [facility]

## 2024-04-07 NOTE — Telephone Encounter (Signed)
Patient has been scheduled for televisit.

## 2024-04-07 NOTE — Telephone Encounter (Signed)
 Patient has been scheduled for televisit and consent done     Patient Consent for Virtual Visit         Albert [name] has provided verbal consent on 04/07/2024 for a virtual visit (video or telephone).   CONSENT FOR VIRTUAL VISIT FOR:  Albert [name]  By participating in this virtual visit I agree to the following:  I hereby voluntarily request, consent and authorize [facility] and its employed or contracted physicians, physician assistants, nurse practitioners or other licensed health care professionals (the Practitioner), to provide me with telemedicine health care services (the "Services) as deemed necessary by the treating Practitioner. I acknowledge and consent to receive the Services by the Practitioner via telemedicine. I understand that the telemedicine visit will involve communicating with the Practitioner through live audiovisual communication technology and the disclosure of certain medical information by electronic transmission. I acknowledge that I have been given the opportunity to request an in-person assessment or other available alternative prior to the telemedicine visit and am voluntarily participating in the telemedicine visit.  I understand that I have the right to withhold or withdraw my consent to the use of telemedicine in the course of my care at any time, without affecting my right to future care or treatment, and that the Practitioner or I may terminate the telemedicine visit at any time. I understand that I have the right to inspect all information obtained and/or recorded in the course of the telemedicine visit and may receive copies of available information for a reasonable fee.  I understand that some of the potential risks of receiving the Services via telemedicine include:  Delay or interruption in medical evaluation due to technological equipment failure or disruption; Information transmitted may not be sufficient (e.g. poor resolution of images) to  allow for appropriate medical decision making by the Practitioner; and/or  In rare instances, security protocols could fail, causing a breach of personal health information.  Furthermore, I acknowledge that it is my responsibility to provide information about my medical history, conditions and care that is complete and accurate to the best of my ability. I acknowledge that Practitioner's advice, recommendations, and/or decision may be based on factors not within their control, such as incomplete or inaccurate data provided by me or distortions of diagnostic images or specimens that may result from electronic transmissions. I understand that the practice of medicine is not an exact science and that Practitioner makes no warranties or guarantees regarding treatment outcomes. I acknowledge that a copy of this consent can be made available to me via my patient portal [facility] MyChart), or I can request a printed copy by calling the office of [facility].    I understand that my insurance will be billed for this visit.   I have read or had this consent read to me. I understand the contents of this consent, which adequately explains the benefits and risks of the Services being provided via telemedicine.  I have been provided ample opportunity to ask questions regarding this consent and the Services and have had my questions answered to my satisfaction. I give my informed consent for the services to be provided through the use of telemedicine in my medical care

## 2024-04-07 NOTE — Telephone Encounter (Signed)
 Patient with diagnosis of atrial fibrillation on Eliquis  for anticoagulation.    Procedure:  COLONOSCOPY   Date of Surgery:  Clearance 05/12/24   CHA2DS2-VASc Score = 3   This indicates a 3.2% annual risk of stroke. The patient's score is based upon: CHF History: 0 HTN History: 1 Diabetes History: 0 Stroke History: 0 Vascular Disease History: 1 Age Score: 1 Gender Score: 0   CrCl 86 Platelet count 185  Patient has not had an Afib/aflutter ablation or Watchman within the last 3 months or DCCV within the last 30 days   Per office protocol, patient can hold Eliquis  for 2 days prior to procedure.   Patient will not need bridging with Lovenox (enoxaparin) around procedure.  **This guidance is not considered finalized until pre-operative APP has relayed final recommendations.**

## 2024-04-29 NOTE — Telephone Encounter (Signed)
 2nd request received.  Pt has TELE Preop appt on 05/06/24.  Will update the surgeons office.

## 2024-05-06 ENCOUNTER — Ambulatory Visit: Attending: Cardiology

## 2024-05-06 DIAGNOSIS — Z01810 Encounter for preprocedural cardiovascular examination: Secondary | ICD-10-CM

## 2024-05-06 NOTE — Progress Notes (Signed)
 Virtual Visit via Telephone Note   Because of [name] co-morbid illnesses, he is at least at moderate risk for complications without adequate follow up.  This format is felt to be most appropriate for this patient at this time.  Due to technical limitations with video connection (technology), today's appointment will be conducted as an audio only telehealth visit, and [name] verbally agreed to proceed in this manner.   All issues noted in this document were discussed and addressed.  No physical exam could be performed with this format.  Evaluation Performed:  Preoperative cardiovascular risk assessment _____________   Date:  05/06/2024   Patient ID:  Albert [name], DOB Dec 27, 1950, MRN [id number] Patient Location:  Home Provider location:   Office  Primary Care Provider:  Darilyn Rosalva Bruckner, PA-C Primary Cardiologist:  Alm Clay, MD  Chief Complaint / Patient Profile   73 y.o. y/o male with a h/o atrial fibrillation, HTN who is pending colonoscopy and presents today for telephonic preoperative cardiovascular risk assessment.  History of Present Illness    Albert [name] is a 73 y.o. male who presents via audio/video conferencing for a telehealth visit today.  Pt was last seen in cardiology clinic on 12/17/2023 by Dr. Inocencio.  At that time Dallas Torok was doing well .  The patient is now pending procedure as outlined above. Since his last visit, he continues to be stable from a cardiac standpoint.  Today he denies chest pain, shortness of breath, lower extremity edema, fatigue, palpitations, melena, hematuria, hemoptysis, diaphoresis, weakness, presyncope, syncope, orthopnea, and PND.   Past Medical History    Past Medical History:  Diagnosis Date   Allergic rhinitis    Aortic atherosclerosis 06/2021   Noted on CT scan   BPH (benign prostatic hyperplasia)    Coronary artery calcification seen on CAT scan 06/2021   GERD (gastroesophageal  reflux disease)    Hyperlipidemia    Hypertension    Persistent atrial fibrillation (HCC) 06/30/2021   CHA2DS2-VASc score 3-on Eliquis  and diltiazem    Past Surgical History:  Procedure Laterality Date   CARDIOVERSION N/A 07/27/2021   Procedure: CARDIOVERSION;  Surgeon: Mona Vinie BROCKS, MD;  Location: [facility] ENDOSCOPY;  Service: Cardiovascular;  Laterality: N/A;   CARDIOVERSION N/A 09/25/2022   Procedure: CARDIOVERSION;  Surgeon: Kate Bruckner CROME, MD;  Location: [facility] ENDOSCOPY;  Service: Cardiovascular;  Laterality: N/A;   CT CTA CORONARY W/CA SCORE W/CM &/OR WO/CM  08/31/2021   Coronary calcium  score of 176.  Normal coronary origin with right dominance. Minimal CAD in the distal LM and proximal LAD/Lcx as outlined above.  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   TRANSTHORACIC ECHOCARDIOGRAM  06/2021   (In setting of A. fib)EF 60 to 65%.  Mild LAE. Mildly elevated RAP.    Allergies  No Known Allergies  Home Medications    Prior to Admission medications   Medication Sig Start Date End Date Taking? Authorizing Provider  Cholecalciferol 50 MCG (2000 UT) TABS Take 2,000 Units by mouth daily.    [provider]  diltiazem  (CARDIZEM  CD) 240 MG 24 hr capsule TAKE 1 CAPSULE BY MOUTH DAILY 12/25/23   Clay Alm ORN, MD  diltiazem  (CARDIZEM ) 30 MG tablet Take 1 tablet (30 mg total) by mouth every 8 (eight) hours as needed (HR>95 or afib ,). 08/15/22   Clay Alm ORN, MD  ELIQUIS  5 MG TABS tablet TAKE 1 TABLET BY MOUTH 2 TIMES A DAY 12/24/23  Camnitz, Will Gladis, MD  flecainide  (TAMBOCOR ) 50 MG tablet Take 1 tablet (50 mg total) by mouth 2 (two) times daily. 12/31/23   Camnitz, Soyla Gladis, MD  lisinopril  (ZESTRIL ) 20 MG tablet Take 1 tablet (20 mg total) by mouth in the morning and at bedtime. 12/17/23 03/16/24  Camnitz, Soyla Gladis, MD  rosuvastatin  (CRESTOR ) 20 MG tablet Take 20 mg by mouth daily. 09/05/22   [provider]  triamcinolone  cream (KENALOG ) 0.1 % Apply 1 Application topically 2 (two) times daily. 11/25/23   Mecum, Erin E, PA-C  vitamin B-12 (CYANOCOBALAMIN) 100 MCG tablet Take 100 mcg by mouth daily.    [provider]  VYZULTA 0.024 % SOLN Apply to eye. 11/15/22   [provider]    Physical Exam    Vital Signs:  [name] does not have vital signs available for review today.  Given telephonic nature of communication, physical exam is limited. AAOx3. NAD. Normal affect.  Speech and respirations are unlabored.  Accessory Clinical Findings    None  Assessment & Plan    1.  Preoperative Cardiovascular Risk Assessment:COLONOSCOPY   Date of Surgery:  Clearance 05/12/24                                  Surgeon:  DR OLIVA BOOTS  Surgeon's Group or Practice Name:  [facility] Phone number:  [phone] Fax number:  [phone]      Primary Cardiologist: Alm Clay, MD  Chart reviewed as part of pre-operative protocol coverage. Given past medical history and time since last visit, based on ACC/AHA guidelines, [name] would be at acceptable risk for the planned procedure without further cardiovascular testing.   Patient was advised that if he develops new symptoms prior to surgery to contact our office to arrange a follow-up appointment.  He verbalized understanding.  Per office protocol, patient can hold Eliquis  for 2 days prior to procedure.   Patient will not need bridging with Lovenox (enoxaparin) around procedure.  I will route this recommendation to the requesting party via Epic fax function and remove from pre-op pool.       Time:   Today, I have spent 5 minutes with the patient with telehealth technology discussing medical history, symptoms, and management plan. I spent 10 minutes reviewing patient's past cardiac history and cardiac medications.     [name], NP  05/06/2024, 7:33 AM

## 2024-06-18 ENCOUNTER — Ambulatory Visit (HOSPITAL_COMMUNITY)
Admission: RE | Admit: 2024-06-18 | Discharge: 2024-06-18 | Disposition: A | Discharge status: Home / Self Care | Source: Ambulatory Visit | Attending: Physician Assistant | Admitting: Physician Assistant

## 2024-06-18 VITALS — BP 142/76 | HR 75 | Ht 72.0 in | Wt 192.2 lb

## 2024-06-18 DIAGNOSIS — D6869 Other thrombophilia: Secondary | ICD-10-CM | POA: Diagnosis not present

## 2024-06-18 DIAGNOSIS — I4819 Other persistent atrial fibrillation: Secondary | ICD-10-CM

## 2024-06-18 DIAGNOSIS — Z79899 Other long term (current) drug therapy: Secondary | ICD-10-CM

## 2024-06-18 DIAGNOSIS — Z5181 Encounter for therapeutic drug level monitoring: Secondary | ICD-10-CM | POA: Diagnosis not present

## 2024-06-18 DIAGNOSIS — I4891 Unspecified atrial fibrillation: Secondary | ICD-10-CM | POA: Diagnosis not present

## 2024-06-18 NOTE — Progress Notes (Signed)
 Primary Care Physician: [name], [name], PA-C Primary Cardiologist: Dr Anner (new) Primary Electrophysiologist: none Referring Physician: Dr Anner Albert [name] is a 73 y.o. male with a history of HTN, HLD, CAD on CT, atrial fibrillation who presents for follow up in the [facility].  The patient was initially diagnosed with atrial fibrillation 06/30/21 after presenting to his PCP with increasing palpitations and exercise intolerance. ECG showed afib with RVR and he was sent to the ED. He was started on diltiazem  for rate control and Eliquis  for stroke prevention. Echo showed preserved EF 60-65%, mild LAE. He denies significant alcohol use or snoring. Patient is s/p DCCV on 07/27/21. He was later started on flecainide  by Dr Inocencio 08/2022.  Patient returns for follow up for atrial fibrillation and flecainide  monitoring. He is in SR today and feels well. He denies any interim symptoms of afib. He checks a Kardia mobile device daily which has shown only SR. No bleeding issues on anticoagulation.   Today, he  denies symptoms of palpitations, chest pain, shortness of breath, orthopnea, PND, lower extremity edema, dizziness, presyncope, syncope, snoring, daytime somnolence, bleeding, or neurologic sequela. The patient is tolerating medications without difficulties and is otherwise without complaint today.    Atrial Fibrillation Risk Factors:  he does not have symptoms or diagnosis of sleep apnea. he does not have a history of rheumatic fever. he does not have a history of alcohol use. The patient does not have a history of early familial atrial fibrillation or other arrhythmias.  Atrial Fibrillation Management history:  Previous antiarrhythmic drugs: flecainide   Previous cardioversions: 07/27/21 Previous ablations: none Anticoagulation history: Eliquis    Past Medical History:  Diagnosis Date   Allergic rhinitis    Aortic atherosclerosis  06/2021   Noted on CT scan   BPH (benign prostatic hyperplasia)    Coronary artery calcification seen on CAT scan 06/2021   GERD (gastroesophageal reflux disease)    Hyperlipidemia    Hypertension    Persistent atrial fibrillation (HCC) 06/30/2021   CHA2DS2-VASc score 3-on Eliquis  and diltiazem     Current Outpatient Medications  Medication Sig Dispense Refill   Cholecalciferol 50 MCG (2000 UT) TABS Take 2,000 Units by mouth daily.     diclofenac Sodium (VOLTAREN) 1 % GEL Apply 2 g topically as needed.     diltiazem  (CARDIZEM  CD) 240 MG 24 hr capsule TAKE 1 CAPSULE BY MOUTH DAILY 90 capsule 3   diltiazem  (CARDIZEM ) 30 MG tablet Take 1 tablet (30 mg total) by mouth every 8 (eight) hours as needed (HR>95 or afib ,). 20 tablet 5   ELIQUIS  5 MG TABS tablet TAKE 1 TABLET BY MOUTH 2 TIMES A DAY 180 tablet 1   flecainide  (TAMBOCOR ) 50 MG tablet Take 1 tablet (50 mg total) by mouth 2 (two) times daily. 180 tablet 3   lisinopril  (ZESTRIL ) 20 MG tablet Take 1 tablet (20 mg total) by mouth in the morning and at bedtime. 180 tablet 1   rosuvastatin  (CRESTOR ) 20 MG tablet Take 20 mg by mouth daily.     triamcinolone  cream (KENALOG ) 0.1 % Apply 1 Application topically 2 (two) times daily. (Patient taking differently: Apply 1 Application topically as needed.) 30 g 0   vitamin B-12 (CYANOCOBALAMIN) 100 MCG tablet Take 100 mcg by mouth daily.     VYZULTA 0.024 % SOLN Apply to eye. (Patient taking differently: Place 1 drop into both eyes daily.)     No current facility-administered medications for  this encounter.    ROS- All systems are reviewed and negative except as per the HPI above.  Physical Exam: Vitals:   06/18/24 0939  BP: (!) 142/76  Pulse: 75  Weight: 87.2 kg  Height: 6' (1.829 m)    GEN: Well nourished, well developed in no acute distress CARDIAC: Regular rate and rhythm, no murmurs, rubs, gallops RESPIRATORY:  Clear to auscultation without rales, wheezing or rhonchi  ABDOMEN: Soft,  non-tender, non-distended EXTREMITIES:  No edema; No deformity    Wt Readings from Last 3 Encounters:  06/18/24 87.2 kg  12/17/23 83.9 kg  11/25/23 83.9 kg    EKG Interpretation Date/Time:  Thursday June 18 2024 09:46:32 EST Ventricular Rate:  75 PR Interval:  190 QRS Duration:  100 QT Interval:  384 QTC Calculation: 428 R Axis:   -24  Text Interpretation: Normal sinus rhythm Minimal voltage criteria for LVH, may be normal variant ( R in aVL ) Borderline ECG When compared with ECG of 17-Dec-2023 10:51, No significant change was found Confirmed by Salbador Fiveash ([id number]) on 06/18/2024 10:00:33 AM    Echo 07/01/21 demonstrated  1. Left ventricular ejection fraction, by estimation, is 60 to 65%. The  left ventricle has normal function. The left ventricle has no regional  wall motion abnormalities. Left ventricular diastolic parameters are  indeterminate.   2. Right ventricular systolic function is normal. The right ventricular  size is normal. Tricuspid regurgitation signal is inadequate for assessing PA pressure.   3. Left atrial size was mildly dilated.   4. The mitral valve is normal in structure. No evidence of mitral valve  regurgitation.   5. The aortic valve is normal in structure. Aortic valve regurgitation is  mild. mild focal calcification.   6. The inferior vena cava is dilated in size with >50% respiratory  variability, suggesting right atrial pressure of 8 mmHg.   Conclusion(s)/Recommendation(s): Otherwise normal echocardiogram, with minor abnormalities described in the report.   Epic records are reviewed at length today   CHA2DS2-VASc Score = 3  The patient's score is based upon: CHF History: 0 HTN History: 1 Diabetes History: 0 Stroke History: 0 Vascular Disease History: 1 Age Score: 1 Gender Score: 0       ASSESSMENT AND PLAN: Persistent Atrial Fibrillation (ICD10:  I48.19) The patient's CHA2DS2-VASc score is 3, indicating a 3.2% annual risk of  stroke.   Patient appears to be maintaining SR Continue Eliquis  5 mg BID Continue flecainide  50 mg BID Continue diltiazem  240 mg daily with 30 mg PRN q 8 hours for heart racing.   Secondary Hypercoagulable State (ICD10:  D68.69) The patient is at significant risk for stroke/thromboembolism based upon his CHA2DS2-VASc Score of 3.  Continue Apixaban  (Eliquis ). No bleeding issues.   High Risk Medication Monitoring (ICD 10: U5195107) Patient requires ongoing monitoring for anti-arrhythmic medication which has the potential to cause life threatening arrhythmias. Intervals on ECG acceptable for flecainide  monitoring. Non obstructive CAD noted.   HTN Stable on current regimen  CAD/aortic atherosclerosis No anginal symptoms Followed by Dr Anner   Follow up in the AF clinic in 6 months.    Daril Kicks PA-C Afib Clinic [facility] [address] [place], [place] [address] [phone]

## 2024-06-24 ENCOUNTER — Other Ambulatory Visit: Payer: Self-pay | Admitting: Cardiology

## 2024-06-30 MED ORDER — LISINOPRIL 20 MG PO TABS: 20.0000 mg | ORAL_TABLET | Freq: Two times a day (BID) | ORAL | 1 refills | Status: AC

## 2024-07-02 ENCOUNTER — Other Ambulatory Visit: Payer: Self-pay | Admitting: Cardiology

## 2024-07-02 DIAGNOSIS — I4819 Other persistent atrial fibrillation: Secondary | ICD-10-CM

## 2024-07-02 MED ORDER — APIXABAN 5 MG PO TABS: 5.0000 mg | ORAL_TABLET | Freq: Two times a day (BID) | ORAL | 1 refills | Status: AC

## 2024-07-02 NOTE — Telephone Encounter (Signed)
 Prescription refill request for Eliquis  received. Indication:afib Last office visit:11/25 Scr: 0.94  6/25 Age:72 Weight:87.2  kg  Prescription refilled

## 2024-08-14 ENCOUNTER — Encounter: Payer: Self-pay | Admitting: Cardiology

## 2024-08-14 ENCOUNTER — Ambulatory Visit: Attending: Cardiology | Admitting: Cardiology

## 2024-08-14 VITALS — BP 130/60 | HR 68 | Ht 72.0 in | Wt 197.1 lb

## 2024-08-14 DIAGNOSIS — I77810 Thoracic aortic ectasia: Secondary | ICD-10-CM

## 2024-08-14 DIAGNOSIS — E785 Hyperlipidemia, unspecified: Secondary | ICD-10-CM | POA: Diagnosis not present

## 2024-08-14 DIAGNOSIS — I10 Essential (primary) hypertension: Secondary | ICD-10-CM | POA: Diagnosis not present

## 2024-08-14 DIAGNOSIS — D6869 Other thrombophilia: Secondary | ICD-10-CM

## 2024-08-14 DIAGNOSIS — I2510 Atherosclerotic heart disease of native coronary artery without angina pectoris: Secondary | ICD-10-CM

## 2024-08-14 DIAGNOSIS — I480 Paroxysmal atrial fibrillation: Secondary | ICD-10-CM

## 2024-08-14 DIAGNOSIS — I4819 Other persistent atrial fibrillation: Secondary | ICD-10-CM | POA: Diagnosis not present

## 2024-08-14 NOTE — Patient Instructions (Addendum)
 Medication Instructions:   -In the event of a breakthrough AFib episode, increase Flecainide  to 100mg  for that dose and the following dose, not to exceed 200mg  in a day.   - This is in addition to taking the PRN short acting diltiazem  30. - Continue flecainide  to 100 mg twice daily (BID) until converted - then 3 additional doses @ 100 mg BID -  Then return to 50 mg BID  *If you need a refill on your cardiac medications before your next appointment, please call your pharmacy*  Lab Work: NOT NEEDED If you have labs (blood work) drawn today and your tests are completely normal, you will receive your results only by: MyChart Message (if you have MyChart) OR A paper copy in the mail If you have any lab test that is abnormal or we need to change your treatment, we will call you to review the results.  Testing/Procedures: NOT NEEDED  Follow-Up: At [facility], you and your health needs are our priority.  As part of our continuing mission to provide you with exceptional heart care, our providers are all part of one team.  This team includes your primary Cardiologist (physician) and Advanced Practice Providers or APPs (Physician Assistants and Nurse Practitioners) who all work together to provide you with the care you need, when you need it.  Your next appointment:   1 year(s)  Provider:   Alm Clay, MD    We recommend signing up for the patient portal called MyChart.  Sign up information is provided on this After Visit Summary.  MyChart is used to connect with patients for Virtual Visits (Telemedicine).  Patients are able to view lab/test results, encounter notes, upcoming appointments, etc.  Non-urgent messages can be sent to your provider as well.   To learn more about what you can do with MyChart, go to [email or web address].   Other Instructions See Breakthrough Rx for Afib above.

## 2024-08-14 NOTE — Progress Notes (Signed)
 " Cardiology Office Note:  .   Date:  08/18/2024  ID:  Albert [name], DOB Sep 30, 1950, MRN [id number] PCP: Darilyn Rosalva Bruckner, PA-C  [facility] Providers Cardiologist:  Alm Clay, MD Electrophysiologist:  Will Gladis Norton, MD     Chief Complaint  Patient presents with   Follow-up   Atrial Fibrillation    No breakthrough    Patient Profile: .     Albert [name] is a 74 y.o. male with a PMH noted below who presents here for annual follow-up at the request of [name], [name], PA-C.  PMH (Paroxysmal-persistent) A-fib diagnosed December 2022 with DCCV (although symptoms probably began in summer 2022) Rhythm control-Flecainide , rate control-diltiazem  to 40 mg; DCCV in December 2022; on Eliquis . -In the event of a breakthrough AFib episode, increase Flecainide  to 100mg  for that dose and the following dose, not to exceed 200mg  in a day.   - This is in addition to taking the PRN short acting diltiazem  30. - Continue flecainide  to 100 mg twice daily until converted - then 3 additional doses @ 100 mg BID Seen by Dr. Norton May 2025: Noted well-controlled A-fib on combination, and diltiazem  was.  Lisinopril  increased to 20 mg twice daily to control.  Provided event stable on plan was to hold off on Coronary CTA (08/31/2021): Coronary Calcium  Score 176 with minimal distal LM and proximal LAD/LCx stenosis of less than 24%.  Nonobstructive. HTN & HLD Thoracic aortic dilation      I last saw Albert [name] in January 2025 doing well with no major issues.  Negative ROS.  No bleeding issues.  Kardia-Mobile for breakthrough spells.  We ordered CT angiogram of chest  He was seen by Mr. Fenton in the A-fib clinic on 06/18/2024: Denied any symptoms of palpitations chest pain pressure or dyspnea.  No CHF symptoms.  Tolerating medications.  Subjective  Discussed the use of AI scribe software for clinical note transcription with the patient, who gave verbal consent to  proceed.  History of Present Illness Apixaban  5 mgKenneth [name] is a 74 year old male with atrial fibrillation and hypertension who presents for a follow-up visit.  He has a history of atrial fibrillation and is on a medication regimen that includes diltiazem  240 mg and flecainide  50 mg twice a day. No breakthrough spells of atrial fibrillation have occurred since his last visit. He uses a KardiaMobile device daily to monitor his heart rhythm, which has shown normal results. During previous episodes of atrial fibrillation, he felt 'funny' and 'amped', similar to consuming a large amount of caffeine. He reports that he does not drink coffee or soft drinks and rarely consumes alcohol, typically limiting himself to no more than two drinks at a time, except on special occasions.  He is on Eliquis  twice a day for anticoagulation and reports no bleeding issues such as blood in stool, urine, or epistaxis, except for occasional epistaxis due to temperature changes.  He has a history of hypertension, currently managed with lisinopril  twice a day and diltiazem . His recent blood pressure readings average 119/70 mmHg, indicating well-controlled blood pressure with this regimen.  He underwent a coronary CTA which showed a stable aortic measurement of 4.2 cm, consistent with previous measurements. His coronary calcium  score was modestly elevated in 2023, and he is on rosuvastatin  20 mg to manage his cholesterol levels. His recent lipid panel showed total cholesterol of 140 mg/dL, triglycerides 47 mg/dL, HDL 55 mg/dL, and LDL 73 mg/dL. His A1c is 5.6, indicating no  diabetes.  He maintains an active lifestyle, exercises regularly, and monitors his weight. He limits alcohol intake to special occasions, typically consuming no more than two drinks at a time.  No breakthrough spells, fast heart rate spells, shortness of breath, chest pain, or swelling in the legs. No bleeding issues such as blood in stool, urine, or  epistaxis, except for occasional epistaxis due to temperature changes.   Cardiovascular ROS: no chest pain or dyspnea on exertion negative for - edema, irregular heartbeat, orthopnea, palpitations, paroxysmal nocturnal dyspnea, rapid heart rate, shortness of breath, or lightheadedness or dizziness or wooziness, syncope or near syncope TIA or amaurosis fugax, claudication   Objective   Medications: Twice daily; diltiazem  CD 240 mg daily; flecainide  50 mg twice daily; as needed diltiazem  30 mg for breakthrough A-fib Lisinopril  20 mg daily; rosuvastatin  20 daily  Studies Reviewed: SABRA   EKG Interpretation Date/Time:  Friday August 14 2024 09:41:17 EST Ventricular Rate:  68 PR Interval:  136 QRS Duration:  100 QT Interval:  394 QTC Calculation: 418 R Axis:   -36  Text Interpretation: Normal sinus rhythm Left axis deviation Borderline criteria for Septal infarct , age undetermined When compared with ECG of 18-Jun-2024 09:46, Septal infarct is now Present Confirmed by Anner Lenis ([id number]) on 08/18/2024 9:25:00 PM    No results found for: CHOL, HDL, LDLCALC, LDLDIRECT, TRIG, CHOLHDL  Results Labs Lipid panel (03/2024): Total cholesterol 140, triglycerides 47, HDL 55, LDL 73 Hemoglobin (04/09/2024): 15.5 A1c (04/09/2024): 5.6 TSH (04/09/2024): 1.74 Creatinine (12/2023): 0.94  Radiology Coronary CTA (2025): Aortic diameter stable at 4.2 cm, thoracic aortic ectasia, no aneurysm Coronary calcium  score (2023): Modestly elevated, coronary artery plaque present CT angio chest-aorta 09/04/2023:: Stable aortic root 4.2 cm.  No changes nonobstructive intrarenal nephrolithiasis bilaterally.  Clear lungs.  Risk Assessment/Calculations:    CHA2DS2-VASc Score = 3   This indicates a 3.2% annual risk of stroke. The patient's score is based upon: CHF History: 0 HTN History: 1 Diabetes History: 0 Stroke History: 0 Vascular Disease History: 1 Age Score: 1 Gender Score: 0         Physical Exam:   VS:  BP 130/60   Pulse 68   Ht 6' (1.829 m)   Wt 197 lb 1.6 oz (89.4 kg)   SpO2 97%   BMI 26.73 kg/m    Wt Readings from Last 3 Encounters:  08/14/24 197 lb 1.6 oz (89.4 kg)  06/18/24 192 lb 3.2 oz (87.2 kg)  12/17/23 185 lb (83.9 kg)      GEN: Well nourished, well groomed; in no acute distress; healthy-appearing NECK: No JVD; No carotid bruits CARDIAC: Normal S1, S2; RRR, no murmurs, rubs, gallops RESPIRATORY:  Clear to auscultation without rales, wheezing or rhonchi ; nonlabored, good air movement. ABDOMEN: Soft, non-tender, non-distended EXTREMITIES:  No edema; No deformity     ASSESSMENT AND PLAN: .   Persistent atrial fibrillation (HCC): CHA2DS2-VASc 3 -> Eliquis  Well-controlled with diltiazem  and flecainide . No adverse reactions. Daily KardiaMobile monitoring shows normal rhythms. Discussed triggers and breakthrough plan. Ablation remains an option if needed. - Continue diltiazem  240 mg daily and flecainide  50 mg twice daily. For breakthrough spells of A-fib: Increase flecainide  to 100 mg for the dose and the following dose, not exceeding 200 mg per day, in addition to short acting diltiazem  30 mg. Continue flecainide  100 mg twice daily until converted, then three additional doses of 100 mg twice daily, then return to 50 mg twice daily. - Monitor heart rhythm with  KardiaMobile device daily. - Avoid known triggers such as excessive caffeine, alcohol, and stress.  Essential hypertension Blood pressure well-controlled with lisinopril  and diltiazem . Current reading 130/60 mmHg. - Continue lisinopril  20 mg twice daily. - Continue diltiazem  CD 240 mg daily for rate control.  Hypercoagulable state due to paroxysmal atrial fibrillation (HCC) CHA2DS2-VASc score is 3.  Managed with Eliquis  5 mg twice daily. Continue Eliquis  5 mg twice daily Okay to hold Eliquis  2 to 3 days preop for surgeries or procedures.  Coronary artery calcification seen on CAT  scan Coronary artery calcification modestly elevated. Rosuvastatin  effectively managing cholesterol. No aspirin due to Eliquis . Emphasized maintaining cholesterol and blood pressure control. - Continue diltiazem  and lisinopril  for blood pressure control and rate control - No ASA due to Eliquis . - Continue rosuvastatin  20 mg daily. - Continue to monitor cholesterol levels regularly.  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 No aneurysm. Regular monitoring planned. - Continue regular monitoring of aortic size with imaging in 1 to 2 years.  Hyperlipidemia LDL goal <100 Cholesterol well-controlled with rosuvastatin . Recent lipid panel within target range.  Goal to maintain LDL < 100, but <70 mg/dL would be target. - Continue rosuvastatin  20 mg daily. - Continue to monitor lipid levels regularly.   Orders Placed This Encounter  Procedures   EKG 12-Lead        Follow-Up: Return in about 1 year (around 08/14/2025) for Routine follow up with me, [employer].      Signed, Alm MICAEL Clay, MD, MS Alm Clay, M.D., M.S. Interventional Cardiologist  [facility] Pager # [phone]      "

## 2024-08-18 ENCOUNTER — Encounter: Payer: Self-pay | Admitting: Cardiology

## 2024-08-18 NOTE — Assessment & Plan Note (Signed)
 Aorta ectatic at 4.2 cm, stable over two years. No aneurysm. Regular monitoring planned. - Continue regular monitoring of aortic size with imaging in 1 to 2 years.

## 2024-08-18 NOTE — Assessment & Plan Note (Addendum)
 Coronary artery calcification modestly elevated. Rosuvastatin  effectively managing cholesterol. No aspirin due to Eliquis . Emphasized maintaining cholesterol and blood pressure control. - Continue diltiazem  and lisinopril  for blood pressure control and rate control - No ASA due to Eliquis . - Continue rosuvastatin  20 mg daily. - Continue to monitor cholesterol levels regularly.

## 2024-08-18 NOTE — Assessment & Plan Note (Signed)
 Well-controlled with diltiazem  and flecainide . No adverse reactions. Daily [employer] monitoring shows normal rhythms. Discussed triggers and breakthrough plan. Ablation remains an option if needed. - Continue diltiazem  240 mg daily and flecainide  50 mg twice daily. For breakthrough spells of A-fib: Increase flecainide  to 100 mg for the dose and the following dose, not exceeding 200 mg per day, in addition to short acting diltiazem  30 mg. Continue flecainide  100 mg twice daily until converted, then three additional doses of 100 mg twice daily, then return to 50 mg twice daily. - Monitor heart rhythm with [employer] device daily. - Avoid known triggers such as excessive caffeine, alcohol, and stress.

## 2024-08-18 NOTE — Assessment & Plan Note (Signed)
 CHA2DS2-VASc score is 3.  Managed with Eliquis  5 mg twice daily. Continue Eliquis  5 mg twice daily Okay to hold Eliquis  2 to 3 days preop for surgeries or procedures.

## 2024-08-18 NOTE — Assessment & Plan Note (Signed)
 Blood pressure well-controlled with lisinopril  and diltiazem . Current reading 130/60 mmHg. - Continue lisinopril  20 mg twice daily. - Continue diltiazem  CD 240 mg daily for rate control.

## 2024-08-18 NOTE — Assessment & Plan Note (Signed)
 Cholesterol well-controlled with rosuvastatin . Recent lipid panel within target range.  Goal to maintain LDL < 100, but <70 mg/dL would be target. - Continue rosuvastatin  20 mg daily. - Continue to monitor lipid levels regularly.

## 2024-12-17 ENCOUNTER — Ambulatory Visit (HOSPITAL_COMMUNITY): Admitting: Physician Assistant
